# Patient Record
Sex: Female | Born: 1935 | Race: White | Hispanic: No | Marital: Married | Smoking: Former smoker
Health system: Southern US, Community
[De-identification: ages and names within clinical notes are randomized; demographics above are authoritative.]

## PROBLEM LIST (undated history)

## (undated) DIAGNOSIS — B029 Zoster without complications: Secondary | ICD-10-CM

## (undated) DIAGNOSIS — E785 Hyperlipidemia, unspecified: Secondary | ICD-10-CM

## (undated) HISTORY — DX: Zoster without complications: B02.9

## (undated) HISTORY — DX: Hyperlipidemia, unspecified: E78.5

---
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
## 1966-01-01 HISTORY — PX: CERVICAL LAMINECTOMY: SHX94

## 1972-01-02 HISTORY — PX: ABDOMINAL HYSTERECTOMY: SHX81

## 1973-01-01 HISTORY — PX: LUNG REMOVAL, PARTIAL: SHX233

## 1995-01-02 HISTORY — PX: OTHER SURGICAL HISTORY: SHX169

## 1997-07-22 ENCOUNTER — Ambulatory Visit (HOSPITAL_COMMUNITY): Admission: RE | Admit: 1997-07-22 | Discharge: 1997-07-22 | Payer: Self-pay | Admitting: *Deleted

## 1998-07-29 ENCOUNTER — Ambulatory Visit (HOSPITAL_COMMUNITY): Admission: RE | Admit: 1998-07-29 | Discharge: 1998-07-29 | Payer: Self-pay | Admitting: *Deleted

## 1998-07-29 ENCOUNTER — Ambulatory Visit (HOSPITAL_COMMUNITY): Admission: RE | Admit: 1998-07-29 | Discharge: 1998-07-29 | Payer: Self-pay

## 1999-07-31 ENCOUNTER — Encounter: Payer: Self-pay | Admitting: *Deleted

## 1999-07-31 ENCOUNTER — Ambulatory Visit (HOSPITAL_COMMUNITY): Admission: RE | Admit: 1999-07-31 | Discharge: 1999-07-31 | Payer: Self-pay | Admitting: Obstetrics & Gynecology

## 2000-08-02 ENCOUNTER — Ambulatory Visit (HOSPITAL_COMMUNITY): Admission: RE | Admit: 2000-08-02 | Discharge: 2000-08-02 | Payer: Self-pay | Admitting: *Deleted

## 2000-08-02 ENCOUNTER — Ambulatory Visit (HOSPITAL_COMMUNITY): Admission: RE | Admit: 2000-08-02 | Discharge: 2000-08-02 | Payer: Self-pay

## 2001-09-30 ENCOUNTER — Ambulatory Visit (HOSPITAL_COMMUNITY): Admission: RE | Admit: 2001-09-30 | Discharge: 2001-09-30 | Payer: Self-pay | Admitting: *Deleted

## 2001-09-30 ENCOUNTER — Encounter: Payer: Self-pay | Admitting: *Deleted

## 2002-10-05 ENCOUNTER — Ambulatory Visit (HOSPITAL_COMMUNITY): Admission: RE | Admit: 2002-10-05 | Discharge: 2002-10-05 | Payer: Self-pay | Admitting: *Deleted

## 2003-01-02 HISTORY — PX: OTHER SURGICAL HISTORY: SHX169

## 2007-01-02 HISTORY — PX: CATARACT EXTRACTION W/ INTRAOCULAR LENS  IMPLANT, BILATERAL: SHX1307

## 2007-09-09 ENCOUNTER — Ambulatory Visit (HOSPITAL_COMMUNITY): Admission: RE | Admit: 2007-09-09 | Discharge: 2007-09-09 | Payer: Self-pay | Admitting: Internal Medicine

## 2008-01-02 DIAGNOSIS — B029 Zoster without complications: Secondary | ICD-10-CM

## 2008-01-02 HISTORY — DX: Zoster without complications: B02.9

## 2008-03-28 ENCOUNTER — Emergency Department (HOSPITAL_COMMUNITY): Admission: EM | Admit: 2008-03-28 | Discharge: 2008-03-28 | Payer: Self-pay | Admitting: Emergency Medicine

## 2010-01-22 ENCOUNTER — Encounter: Payer: Self-pay | Admitting: Family Medicine

## 2010-04-13 LAB — DIFFERENTIAL
Basophils Relative: 0 % (ref 0–1)
Lymphocytes Relative: 23 % (ref 12–46)
Monocytes Absolute: 0.3 10*3/uL (ref 0.1–1.0)
Monocytes Relative: 6 % (ref 3–12)
Neutro Abs: 3.6 10*3/uL (ref 1.7–7.7)
Neutrophils Relative %: 71 % (ref 43–77)

## 2010-04-13 LAB — URINALYSIS, ROUTINE W REFLEX MICROSCOPIC
Bilirubin Urine: NEGATIVE
Glucose, UA: NEGATIVE mg/dL
Hgb urine dipstick: NEGATIVE
Ketones, ur: NEGATIVE mg/dL
Protein, ur: NEGATIVE mg/dL

## 2010-04-13 LAB — CBC
HCT: 46.7 % — ABNORMAL HIGH (ref 36.0–46.0)
Hemoglobin: 15.4 g/dL — ABNORMAL HIGH (ref 12.0–15.0)
MCHC: 32.9 g/dL (ref 30.0–36.0)
MCV: 90.6 fL (ref 78.0–100.0)
Platelets: 143 10*3/uL — ABNORMAL LOW (ref 150–400)
RDW: 14.1 % (ref 11.5–15.5)

## 2010-04-13 LAB — COMPREHENSIVE METABOLIC PANEL
Albumin: 4.1 g/dL (ref 3.5–5.2)
BUN: 9 mg/dL (ref 6–23)
Calcium: 9.2 mg/dL (ref 8.4–10.5)
Creatinine, Ser: 0.9 mg/dL (ref 0.4–1.2)
Glucose, Bld: 106 mg/dL — ABNORMAL HIGH (ref 70–99)
Total Protein: 6.2 g/dL (ref 6.0–8.3)

## 2010-05-25 IMAGING — CT CT PELVIS W/ CM
2 of 4 series · 17 of 46 positions shown, 19 images · IV contrast (APPLIED)
Comparison: None

CT ABDOMEN

CLINICAL DATA: Right lower quadrant pain and right flank pain for
5 days.

CT ABDOMEN AND PELVIS WITH CONTRAST
TECHNIQUE: Multidetector CT imaging of the abdomen and pelvis was
performed using the standard protocol following bolus
administration of intravenous contrast.
Contrast: 125 ml

[Series 2: abd_pel 5.0 b40f st · axial · 0.65mm/px · z∈[-486,-86]mm · 14 of 88 slices shown, 16 images]
[im 4/88  soft-tissue]
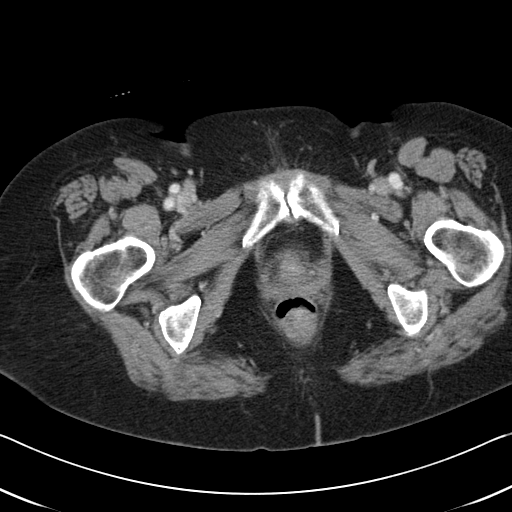
[im 4/88  bone]
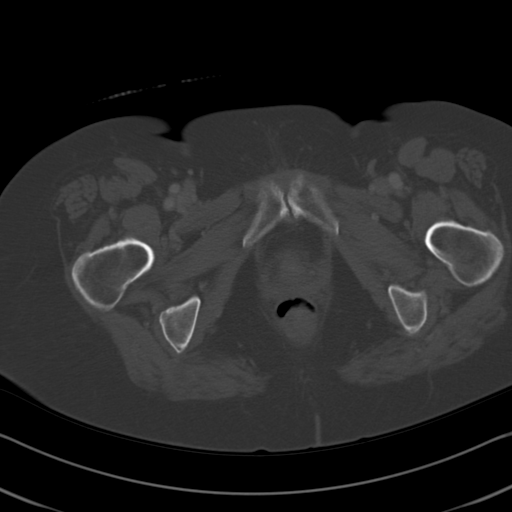
[im 11/88  soft-tissue]
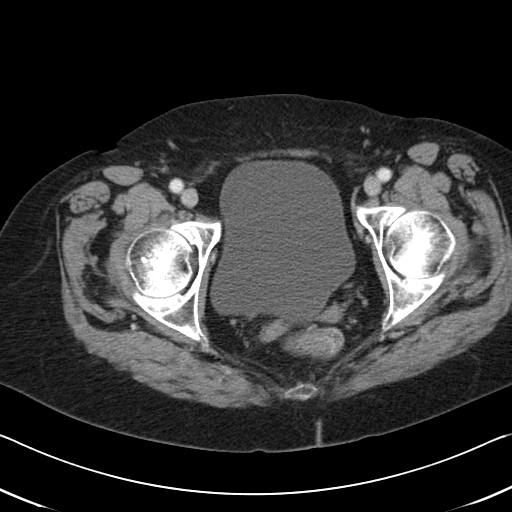
[im 19/88  soft-tissue]
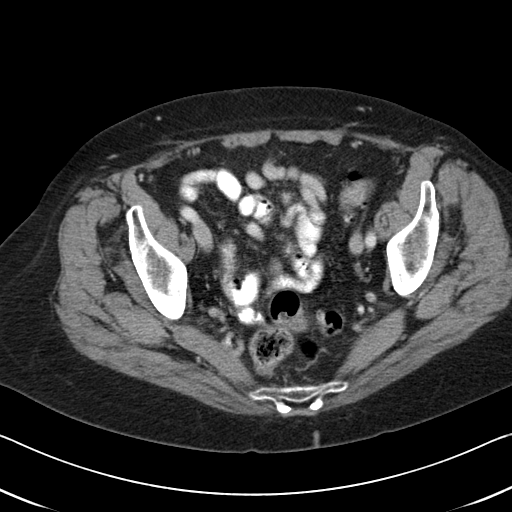
[im 22/88  soft-tissue]
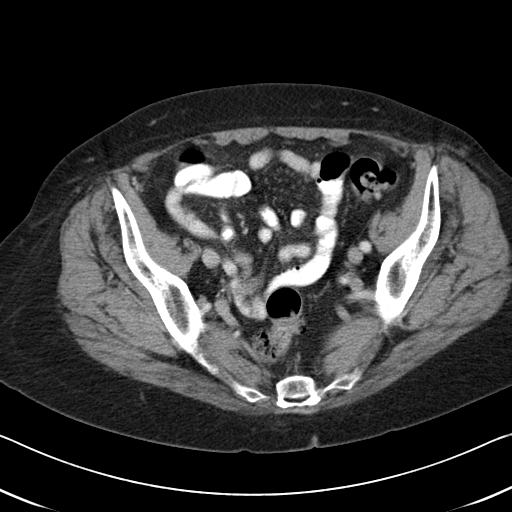
[im 30/88  soft-tissue]
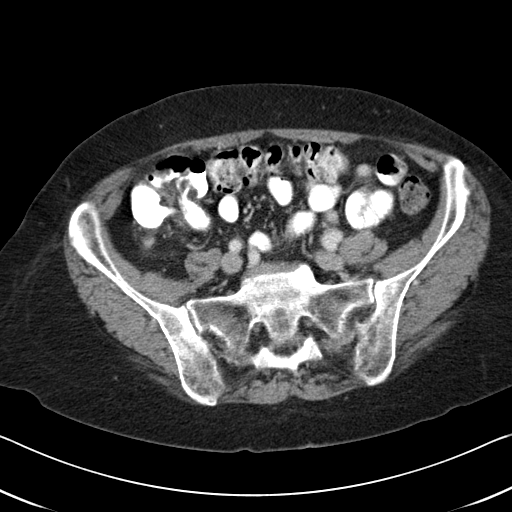
[im 37/88  soft-tissue]
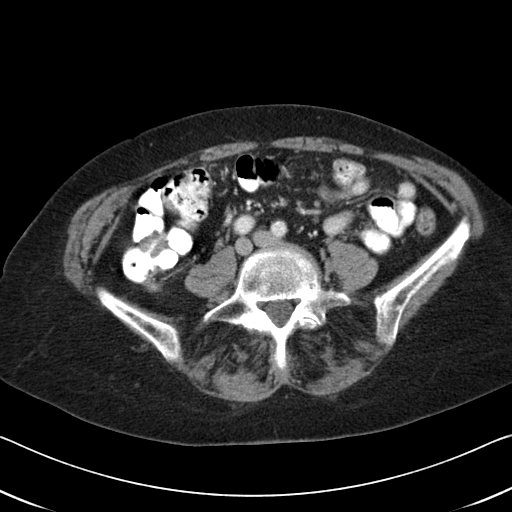
[im 40/88  soft-tissue]
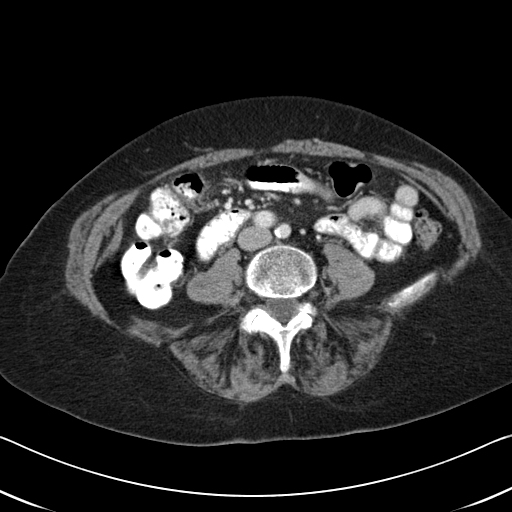
[im 48/88  soft-tissue]
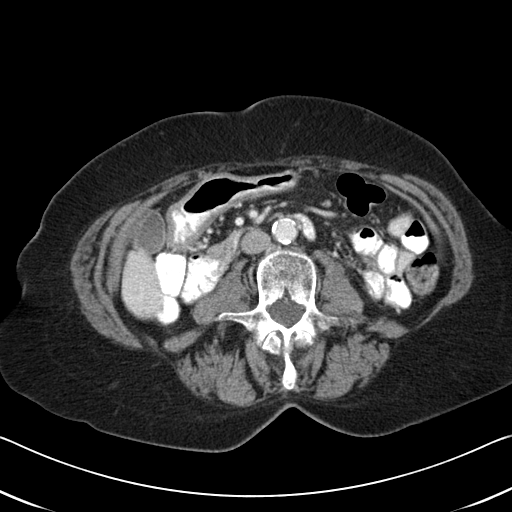
[im 51/88  soft-tissue]
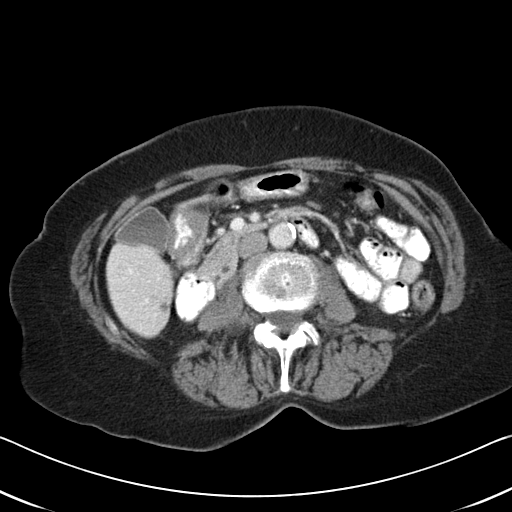
[im 51/88  bone]
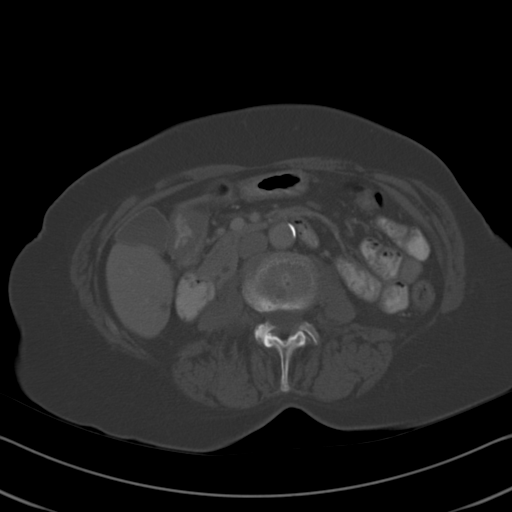
[im 59/88  soft-tissue]
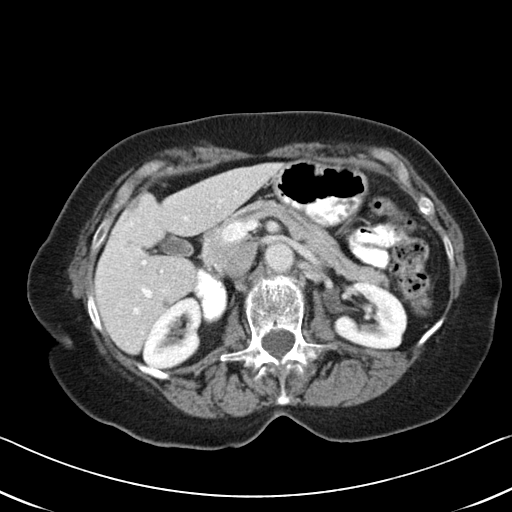
[im 66/88  soft-tissue]
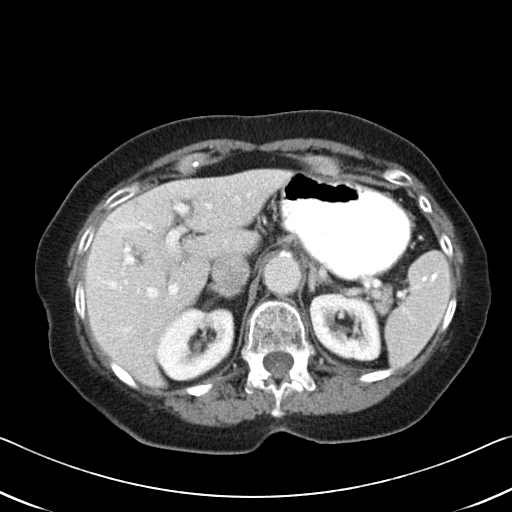
[im 69/88  soft-tissue]
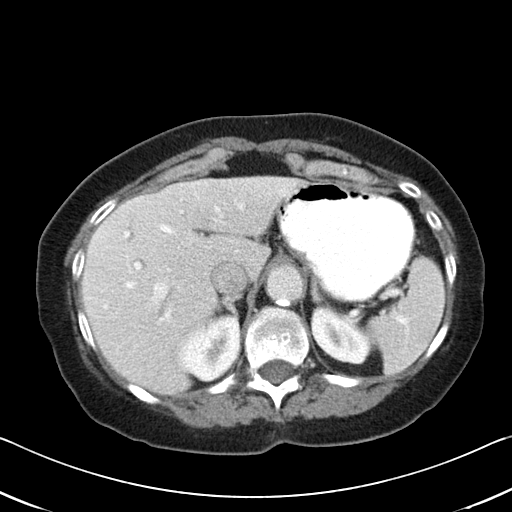
[im 77/88  soft-tissue]
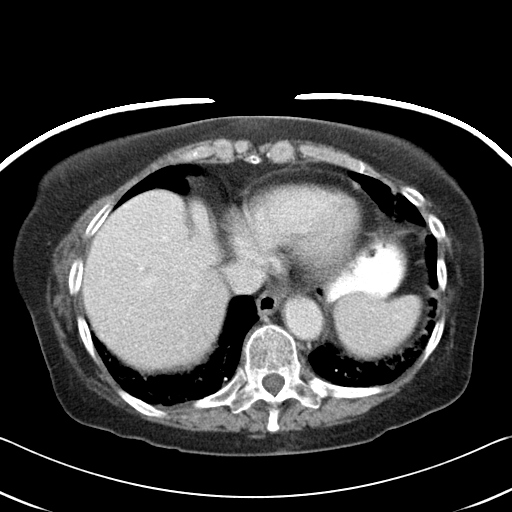
[im 84/88  soft-tissue]
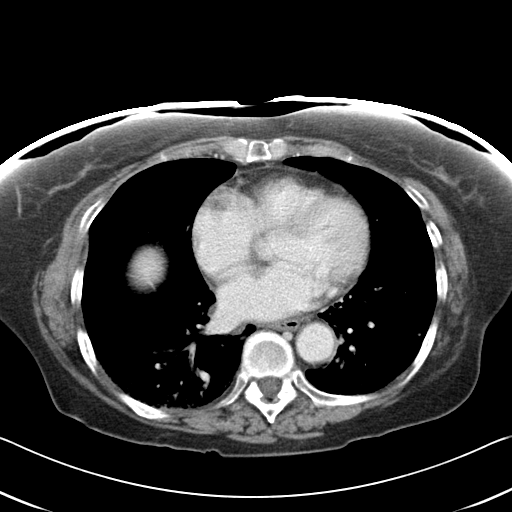

[Series 602: coronal abdomen · coronal · 0.89mm/px · 3 of 102 slices shown]
[im 34/102  soft-tissue]
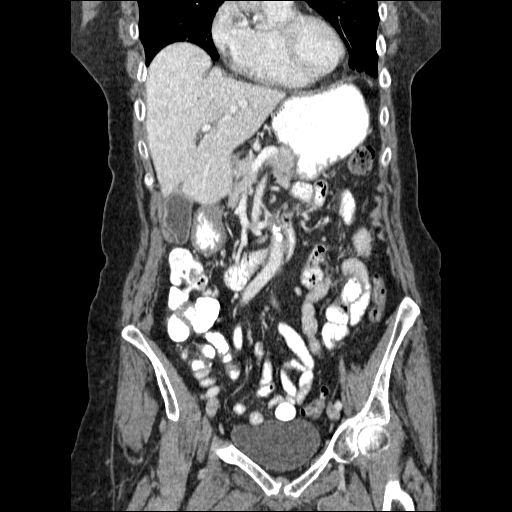
[im 45/102  soft-tissue]
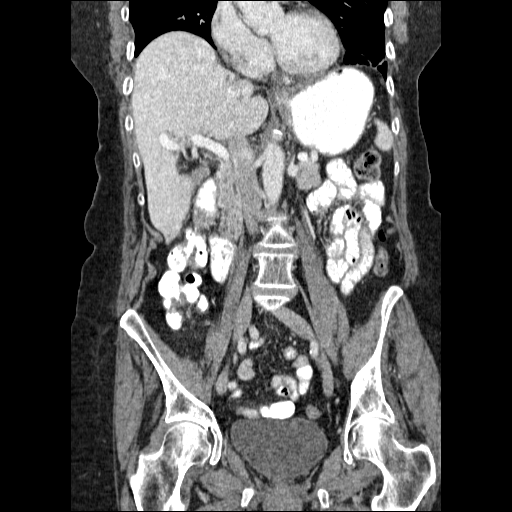
[im 57/102  soft-tissue]
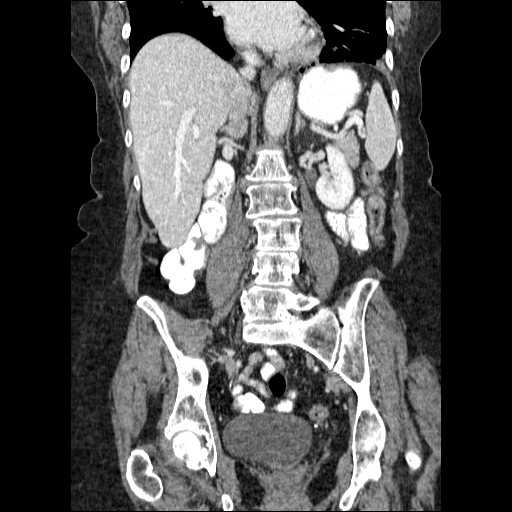

[17 of 46 positions shown; findings below may reference images not displayed]

FINDINGS: The liver, spleen, pancreas, adrenal glands, and kidneys
demonstrate no significant abnormalities.  There are two benign-
appearing small cysts in the right leg lower lobe of the liver.
There is a small focal area of fatty infiltration in the liver just
anterior to the gallbladder.

There are calcified granulomas in the spleen.

There are no dilated loops of large or small bowel.  No free air
free fluid or significant bony abnormalities.
IMPRESSION: Benign-appearing abdomen. There is mild atelectasis or scarring at
both lung bases posteriorly.

CT PELVIS
FINDINGS: The terminal ileum and appendix are normal.  Uterus and
ovaries have been removed.  There is no free fluid or
diverticulitis.  There are a few diverticuli in the distal colon.
No significant bony abnormalities.
IMPRESSION: Benign-appearing pelvis.

## 2010-08-07 ENCOUNTER — Telehealth: Payer: Self-pay | Admitting: Cardiovascular Disease

## 2010-08-07 NOTE — Telephone Encounter (Signed)
This is the wife off a patient that Dr. Elease Hashimoto see's.  She is seeing Dossie Arbour now, but does not feel comfortable.  She would like to request who Dr. Elease Hashimoto would recommend as a PCP for her.  She is having some GI issues.   [phone]

## 2010-08-07 NOTE — Telephone Encounter (Signed)
Patient was told it may be next week before there was a call back and she understands, she will call us back if she does not hear anything by next week.

## 2010-08-07 NOTE — Telephone Encounter (Signed)
Sent to dr Elease Hashimoto, will address next weekend.

## 2010-08-10 ENCOUNTER — Telehealth: Payer: Self-pay | Admitting: *Deleted

## 2010-08-10 NOTE — Telephone Encounter (Signed)
msg left for sugg on new pcp/ dr Rene Paci [phone]

## 2010-10-10 ENCOUNTER — Ambulatory Visit (INDEPENDENT_AMBULATORY_CARE_PROVIDER_SITE_OTHER): Payer: Medicare Other | Admitting: Internal Medicine

## 2010-10-10 ENCOUNTER — Encounter: Payer: Self-pay | Admitting: Internal Medicine

## 2010-10-10 DIAGNOSIS — E785 Hyperlipidemia, unspecified: Secondary | ICD-10-CM

## 2010-10-10 DIAGNOSIS — K6289 Other specified diseases of anus and rectum: Secondary | ICD-10-CM

## 2010-10-10 NOTE — Progress Notes (Signed)
Subjective:    Patient ID: Nancy Kent, female    DOB: 12-22-1925, 77 y.o.   MRN: [id number]  HPI Nancy Kent presents today to establish for on-going continuity care.   Her chief complaint today is rectal pain for the past two months. She has been using a hydrocortisone suppository for rectal pain. She has had no bleeding. She has no h/o internal hemorrhoid. Her last flexible sigmoidoscopy was greater than 8 years ago. She has not had any weight loss. She has not had a durable change in caliber or color of her stools.   She reports acute back pain yesterday when she awoke. This pain has lessened some over the past 24 hours. She did have increased urinary frequency yesterday as well along with urgency but no incontinence. She denies any strain or injury to cause her pain. No history of serious back problems.   Past Medical History  Diagnosis Date  . Hyperlipidemia   . Shingles 2010    happended after she had vaccine.   Past Surgical History  Procedure Date  . Tonsillectomy 1937  . Abdominal hysterectomy 1974    fibroid  . Lung removal, partial 1975    cocciodiomycosis. LUL lobe removed.  . Cervical laminectomy 1968    C5-6  . Arthroscopy 2005    left knee  . 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 Cataract extraction w/ intraocular lens  implant, bilateral 2007-05-26    bilateral   Family History  Problem Relation Age of Onset  . Heart disease Mother     CAD/MI - fatal  . Diabetes Mother   . Cancer Father     lung cancer  . Alzheimer's disease Sister   . Dementia Brother   . Heart disease Sister     CAD/MI  . Diabetes Brother    History   Social History  . Marital Status: Married    Spouse Name: N/A    Number of Children: 3  . Years of Education: 12   Occupational History  . clerical     retired   Social History Main Topics  . Smoking status: Former Smoker    Quit date: 10/10/1958  . Smokeless tobacco: Never Used  . Alcohol Use: No       former drinker - social  . Drug Use: No  . Sexually Active: Not Currently   Other Topics Concern  . Not on file   Social History Narrative   HSG, Business school. Married 2045-05-25 05-26-2030 yrs/widowed- husband died of lung cancer and she nursed him at home for 3 years. Married 05-26-1979 - husband is 57 with multiple medical problems. She is providing care w/o assistance 24/7 which is taxing. 2 sons - '49, '56; 1 dtr 05-25-48. 5 grandchildren, 3 great-grandchildren. Work - Warehouse manager, now retired. Lives with husband in an independent apartment. ACP- No CPR, no mechanical ventilation. No heroic care - no dialysis, no prolonged intensive care. Has a living will. Provided with out of facility order. Provided with blank MOST form (Oct '12) [facility] - [place], then d[place] in [place].       Review of Systems Constitutional:  Negative for fever, chills, activity change and unexpected weight change.  HEENT:  Positive for hearing loss but no audiology. No  ear pain, congestion, neck stiffness and postnasal drip. Negative for sore throat or swallowing problems. Negative for dental complaints.   Eyes: Negative for vision loss or change in visual acuity.  Respiratory:  Negative for chest tightness and wheezing. Negative for DOE.   Cardiovascular: Negative for chest pain or palpitations. No decreased exercise tolerance Gastrointestinal: No change in bowel habit. No bloating or gas. No reflux or indigestion Genitourinary: Negative for urgency, frequency, flank pain and difficulty urinating.  Musculoskeletal: Negative for myalgias, back pain, arthralgias and gait problem.  Neurological: Negative for dizziness, tremors, weakness and headaches. Mild balance problem Hematological: Negative for adenopathy.  Psychiatric/Behavioral: Negative for behavioral problems and dysphoric mood.       Objective:   Physical Exam Vitals - noted, normal BP, afebrile Gen'l - WNWD white woman who appears quite fit  HEENT- C&S clear, PERRLA, no oral  lesions, EACs/TMs normal Neck- supple, no thyromegaly Cor- 2+ radial pulse, RRR, no JVD, no carotid bruits Pulm - normal respirations        Assessment & Plan:  Rectal pain - patient gets some relief from hydrocortisone suppository. Last flex sig several years ago.  Plan - patient to return for follow-up exam soon. Will need anoscopy

## 2010-10-11 ENCOUNTER — Encounter: Payer: Self-pay | Admitting: Internal Medicine

## 2010-10-11 DIAGNOSIS — E785 Hyperlipidemia, unspecified: Secondary | ICD-10-CM | POA: Insufficient documentation

## 2010-10-11 NOTE — Assessment & Plan Note (Signed)
No lab information available at today's visit. She is not taking any medications.  Plan - obtain labs from prior physician with recommendations re: repeat lab or treatment to follow.

## 2010-10-13 ENCOUNTER — Ambulatory Visit (INDEPENDENT_AMBULATORY_CARE_PROVIDER_SITE_OTHER): Payer: Medicare Other | Admitting: Internal Medicine

## 2010-10-13 VITALS — BP 112/76 | HR 96 | Temp 97.1°F

## 2010-10-13 DIAGNOSIS — K6289 Other specified diseases of anus and rectum: Secondary | ICD-10-CM

## 2010-10-16 ENCOUNTER — Encounter: Payer: Self-pay | Admitting: Gastroenterology

## 2010-10-16 NOTE — Progress Notes (Signed)
  Subjective:    Patient ID: Nancy Kent, female    DOB: 1925/08/20, 77 y.o.   MRN: [id number]  HPI [name] was recently seen as a new patient - see tht note. She returns today for evaluation of rectal pain that has been going on for several weeks. She has had no bleeding, no report of hemorrhoids. She does get relief with cortisone suppositories.  She returns with a complete MOST form.  I have reviewed the patient's medical history in detail and updated the computerized patient record.    Review of Systems System review is negative for any constitutional, cardiac, pulmonary, GI or neuro symptoms or complaints other than as described in the HPI.     Objective:   Physical Exam Vitals noted  Procedure - Ansocopy Full explanation provided and patient gives informed consent. In the right lateral decubitus position - external exam normal w/o hemorrhoids; digital rectal exam w/o palpable hemorrhoids or mass. Anascope was gently introduced. Mucosa sampling above the scope was heme negative. On withdrawal of the scope no abnormalities were noted: no hemorrhoids, fissures or masses.  Patient tolerated procedure well.        Assessment & Plan:  Rectal pain - no explanation after exam.  Plan - refer to GI for further evaluation.

## 2010-10-30 ENCOUNTER — Ambulatory Visit: Payer: Self-pay | Admitting: Internal Medicine

## 2010-11-06 ENCOUNTER — Ambulatory Visit: Payer: Medicare Other | Admitting: Gastroenterology

## 2011-01-30 ENCOUNTER — Encounter: Payer: Self-pay | Admitting: Internal Medicine

## 2011-01-30 ENCOUNTER — Ambulatory Visit (INDEPENDENT_AMBULATORY_CARE_PROVIDER_SITE_OTHER): Payer: Medicare Other | Admitting: Internal Medicine

## 2011-01-30 ENCOUNTER — Ambulatory Visit (HOSPITAL_COMMUNITY)
Admission: RE | Admit: 2011-01-30 | Discharge: 2011-01-30 | Disposition: A | Discharge status: Home / Self Care | Payer: Medicare Other | Source: Ambulatory Visit | Attending: Internal Medicine | Admitting: Internal Medicine

## 2011-01-30 VITALS — BP 108/78 | HR 77 | Temp 97.6°F | Resp 14 | Wt 152.5 lb

## 2011-01-30 DIAGNOSIS — W19XXXA Unspecified fall, initial encounter: Secondary | ICD-10-CM | POA: Insufficient documentation

## 2011-01-30 DIAGNOSIS — S060X9A Concussion with loss of consciousness of unspecified duration, initial encounter: Secondary | ICD-10-CM

## 2011-01-30 DIAGNOSIS — R4789 Other speech disturbances: Secondary | ICD-10-CM

## 2011-01-30 MED ORDER — ALPRAZOLAM 0.5 MG PO TABS: 0.5000 mg | ORAL_TABLET | Freq: Every evening | ORAL | Status: DC | PRN

## 2011-01-30 NOTE — Patient Instructions (Signed)
After fall injury - the pain in your neck and anterior chest wall are muscle strain. Plan - neck rolls and chest stretches; tylenol 500 mg 1 or 2 three times a day. For unrelieved pain you may ADD motrin or aleve as directed. For subtle neurologic change will need to get a CT brain today to rule out a subdural hematoma - blood clot on the brain. If it is normal we are done. If there is a clot, with your exam being pretty normal, we will watch and wait and get a repeat study in 10-14 days. There is also the possibility of a mild concussion for which tincture of time is the cure.       Concussion and Brain Injury A blow or jolt to the head can disrupt the normal function of the brain. This type of brain injury is often called a "concussion" or a "closed head injury." Concussions are usually not life-threatening. Even so, the effects of a concussion can be serious.   CAUSES   A concussion is caused by a blunt blow to the head. The blow might be direct or indirect as described below.  Direct blow (running into another player during a soccer game, being hit in a fight, or hitting your head on a hard surface).     Indirect blow (when your head moves rapidly and violently back and forth like in a car crash).  SYMPTOMS   The brain is very complex. Every head injury is different. Some symptoms may appear right away. Other symptoms may not show up for days or weeks after the concussion. The signs of concussion can be hard to notice. Early on, problems may be missed by patients, family members, and caregivers. You may look fine even though you are acting or feeling differently.   These symptoms are usually temporary, but may last for days, weeks, or even longer. Symptoms include:  Mild headaches that will not go away.     Having more trouble than usual with:     Remembering things.     Paying attention or concentrating.     Organizing daily tasks.     Making decisions and solving problems.      Slowness in thinking, acting, speaking, or reading.     Getting lost or easily confused.     Feeling tired all the time or lacking energy (fatigue).     Feeling drowsy.     Sleep disturbances.     Sleeping more than usual.     Sleeping less than usual.     Trouble falling asleep.     Trouble sleeping (insomnia).     Loss of balance or feeling lightheaded or dizzy.     Nausea or vomiting.     Numbness or tingling.     Increased sensitivity to:     Sounds.    Lights.    Distractions.  Other symptoms might include:  Vision problems or eyes that tire easily.     Diminished sense of taste or smell.     Ringing in the ears.     Mood changes such as feeling sad, anxious, or listless.     Becoming easily irritated or angry for little or no reason.     Lack of motivation.  DIAGNOSIS   Your caregiver can usually diagnose a concussion or mild brain injury based on your description of your injury and your symptoms.   Your evaluation might include:  A brain scan to look for signs  of injury to the brain. Even if the test shows no injury, you may still have a concussion.     Blood tests to be sure other problems are not present.  TREATMENT    People with a concussion need to be examined and evaluated. Most people with concussions are treated in an emergency department, urgent care, or clinic. Some people must stay in the hospital overnight for further treatment.     Your caregiver will send you home with important instructions to follow. Be sure to carefully follow them.     Tell your caregiver if you are already taking any medicines (prescription, over-the-counter, or natural remedies), or if you are drinking alcohol or taking illegal drugs. Also, talk with your caregiver if you are taking blood thinners (anticoagulants) or aspirin. These drugs may increase your chances of complications. All of this is important information that may affect treatment.     Only take  over-the-counter or prescription medicines for pain, discomfort, or fever as directed by your caregiver.  PROGNOSIS   How fast people recover from brain injury varies from person to person. Although most people have a good recovery, how quickly they improve depends on many factors. These factors include how severe their concussion was, what part of the brain was injured, their age, and how healthy they were before the concussion.   Because all head injuries are different, so is recovery. Most people with mild injuries recover fully. Recovery can take time. In general, recovery is slower in older persons. Also, persons who have had a concussion in the past or have other medical problems may find that it takes longer to recover from their current injury. Anxiety and depression may also make it harder to adjust to the symptoms of brain injury. HOME CARE INSTRUCTIONS   Return to your normal activities slowly, not all at once. You must give your body and brain enough time for recovery.  Get plenty of sleep at night, and rest during the day. Rest helps the brain to heal.     Avoid staying up late at night.     Keep the same bedtime hours on weekends and weekdays.     Take daytime naps or rest breaks when you feel tired.     Limit activities that require a lot of thought or concentration (brain or cognitive rest). This includes:     Homework or job-related work.     Watching TV.     Computer work.     Avoid activities that could lead to a second brain injury, such as contact or recreational sports, until your caregiver says it is okay. Even after your brain injury has healed, you should protect yourself from having another concussion.     Ask your caregiver when you can return to your normal activities such as driving, bicycling, or operating heavy equipment. Your ability to react may be slower after a brain injury.     Talk with your caregiver about when you can return to work or school.      Inform your teachers, school nurse, school counselor, coach, Event organiser, or work Production designer, theatre/television/film about your injury, symptoms, and restrictions. They should be instructed to report:     Increased problems with attention or concentration.     Increased problems remembering or learning new information.     Increased time needed to complete tasks or assignments.     Increased irritability or decreased ability to cope with stress.     Increased symptoms.  Take only those medicines that your caregiver has approved.     Do not drink alcohol until your caregiver says you are well enough to do so. Alcohol and certain other drugs may slow your recovery and can put you at risk of further injury.     If it is harder than usual to remember things, write them down.     If you are easily distracted, try to do one thing at a time. For example, do not try to watch TV while fixing dinner.     Talk with family members or close friends when making important decisions.     Keep all follow-up appointments. Repeated evaluation of your symptoms is recommended for your recovery.  PREVENTION   Protect your head from future injury. It is very important to avoid another head or brain injury before you have recovered. In rare cases, another injury has lead to permanent brain damage, brain swelling, or death. Avoid injuries by using:  Seatbelts when riding in a car.     Alcohol only in moderation.     A helmet when biking, skiing, skateboarding, skating, or doing similar activities.     Safety measures in your home.     Remove clutter and tripping hazards from floors and stairways.     Use grab bars in bathrooms and handrails by stairs.     Place non-slip mats on floors and in bathtubs.     Improve lighting in dim areas.  SEEK MEDICAL CARE IF:   A head injury can cause lingering symptoms. You should seek medical care if you have any of the following symptoms for more than 3 weeks after your injury  or are planning to return to sports:  Chronic headaches.     Dizziness or balance problems.     Nausea.    Vision problems.     Increased sensitivity to noise or light.     Depression or mood swings.     Anxiety or irritability.     Memory problems.     Difficulty concentrating or paying attention.     Sleep problems.     Feeling tired all the time.  SEEK IMMEDIATE MEDICAL CARE IF:   You have had a blow or jolt to the head and you (or your family or friends) notice:  Severe or worsening headaches.     Weakness (even if only in one hand or one leg or one part of the face), numbness, or decreased coordination.     Repeated vomiting.     Increased sleepiness or passing out.     One black center of the eye (pupil) is larger than the other.     Convulsions (seizures).     Slurred speech.     Increasing confusion, restlessness, agitation, or irritability.     Lack of ability to recognize people or places.     Neck pain.     Difficulty being awakened.     Unusual behavior changes.     Loss of consciousness.  Older adults with a brain injury may have a higher risk of serious complications such as a blood clot on the brain. Headaches that get worse or an increase in confusion are signs of this complication. If these signs occur, see a caregiver right away. MAKE SURE YOU:    Understand these instructions.     Will watch your condition.     Will get help right away if you are not doing well or get worse.  FOR MORE INFORMATION   Several groups help people with brain injury and their families. They provide information and put people in touch with local resources. These include support groups, rehabilitation services, and a variety of health care professionals. Among these groups, the Brain Injury Association (BIA, www.biausa.org) has a Secretary/administrator that gathers scientific and educational information and works on a national level to help people with brain injury.    Document Released: 03/10/2003 Document Revised: 08/30/2010 Document Reviewed: 08/06/2007 [facility] Patient Information 2012 [place], [place].

## 2011-01-30 NOTE — Progress Notes (Signed)
Subjective:    Patient ID: Nancy Kent, female    DOB: 1925/11/10, 78 y.o.   MRN: [id number]  HPI Patient presents for evaluation of mental status changes after a fall Jan 18th when she lost her footing on the ice, falling backward striking her head hard on the hard ground. She did not loose consciousness but did feel bad for several days. She has had persistent neck pain with all range of motion, she has had anterior chest wall pain. Her memory is a little worse since the fall and word finding is more difficult. There has been no change in vision, hearing or coordination. A hematoma on the vertex scalp has resolved.   Past Medical History  Diagnosis Date  . Hyperlipidemia   . Shingles 2010    happended after she had vaccine.   Past Surgical History  Procedure Date  . Tonsillectomy 1937  . Abdominal hysterectomy 1974    fibroid  . Lung removal, partial 1975    cocciodiomycosis. LUL lobe removed.  . Cervical laminectomy 1968    C5-6  . Arthroscopy 2005    left knee  . 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 Cataract extraction w/ intraocular lens  implant, bilateral 2007/06/14    bilateral   Family History  Problem Relation Age of Onset  . Heart disease Mother     CAD/MI - fatal  . Diabetes Mother   . Cancer Father     lung cancer  . Alzheimer's disease Sister   . Dementia Brother   . Heart disease Sister     CAD/MI  . Diabetes Brother    History   Social History  . Marital Status: Married    Spouse Name: N/A    Number of Children: 3  . Years of Education: 12   Occupational History  . clerical     retired   Social History Main Topics  . Smoking status: Former Smoker    Quit date: 10/10/1958  . Smokeless tobacco: Never Used  . Alcohol Use: No     former drinker - social  . Drug Use: No  . Sexually Active: Not Currently   Other Topics Concern  . Not on file   Social History Narrative   HSG, Business school. Married 06-13-45 06/14/2030  yrs/widowed- husband died of lung cancer and she nursed him at home for 3 years. Married 06/14/1979 - husband is 41 with multiple medical problems. She is providing care w/o assistance 24/7 which is taxing. 2 sons - '49, '56; 1 dtr June 13, 2048. 5 grandchildren, 3 great-grandchildren. Work - Warehouse manager, now retired. Lives with husband in an independent apartment. ACP- No CPR, no mechanical ventilation. No heroic care - no dialysis, no prolonged intensive care. Has a living will. Provided with out of facility order. Provided with blank MOST form (Oct '12) [facility] - [place], then d[place] in [place].      Review of Systems System review is negative for any constitutional, cardiac, pulmonary, GI or neuro symptoms or complaints other than as described in the HPI.     Objective:   Physical Exam Filed Vitals:   01/30/11 1407  BP: 108/78  Pulse: 77  Temp: 97.6 F (36.4 C)  Resp: 14   Gen'l- WNWD white woman in no distress HEENT- no signs of trauma or hematoma Neck- 90% ROM with flexion and extension, 80% with rotation, normal shoulder shrug Chest - no bruising, tender to palpation along the sternum and the  costo-sternal junction. Neuro - A&O x 3, speech is clear, cognition seems normal. CN II- XII - nl facial symmetry and movement, no fasiculation of the tongue, EOMI, Fundi- nl disc and vasculature. MS 4/5 and equal; able to stand w/o assistance. Nl gait, nl tandem gait. DTRs - 2+ and symmetrical.       Assessment & Plan:  Head trauma - neuro exam is essentially normal, however her report of word finding and expression difficult with a decrease in memory since the fall raises concern for SDH.  Plan - CT head w/o contrast to r/o SDH  Addendum: CT normal  Plan - probable mild concussion from the fall as etiology of mild mental status changes           Tincture of time should see a return to baseline function.

## 2011-05-03 ENCOUNTER — Telehealth: Payer: Self-pay | Admitting: *Deleted

## 2011-05-03 NOTE — Telephone Encounter (Signed)
Pharmacist Augusto Gamble called for patient concerning her  Medication alprazolon , Due to the death of her son Ms. Preyer is having a hard time emotionally and with sleeping. She states she has to take 1/2 tablet more in the nite to get back to sleep of alprazolon. Which is going to make her run out of her medication sooner. Do you want to refill more for her or are there other options for her to help with her deal with the loss of her son . Last office visit 01/2011   Fannie Knee

## 2011-05-04 MED ORDER — ALPRAZOLAM 0.5 MG PO TABS: ORAL_TABLET | ORAL | Status: DC

## 2011-05-04 NOTE — Telephone Encounter (Signed)
Medication called to [facility]. Spoke with Ms Shumard  And is aware of medication. Nancy Kent

## 2011-05-04 NOTE — Telephone Encounter (Signed)
OK to take xanax 0.5 1 or 2 tabs at bedtime, #60, refill x 5

## 2011-10-30 ENCOUNTER — Ambulatory Visit (INDEPENDENT_AMBULATORY_CARE_PROVIDER_SITE_OTHER): Payer: Medicare Other

## 2011-10-30 DIAGNOSIS — Z23 Encounter for immunization: Secondary | ICD-10-CM

## 2011-11-16 ENCOUNTER — Other Ambulatory Visit: Payer: Self-pay | Admitting: *Deleted

## 2011-11-16 MED ORDER — ALPRAZOLAM 0.5 MG PO TABS: ORAL_TABLET | ORAL | Status: DC

## 2011-11-16 NOTE — Telephone Encounter (Signed)
Medication refill request.

## 2012-02-25 ENCOUNTER — Encounter: Payer: Self-pay | Admitting: Internal Medicine

## 2012-02-25 ENCOUNTER — Ambulatory Visit (INDEPENDENT_AMBULATORY_CARE_PROVIDER_SITE_OTHER): Payer: Medicare Other | Admitting: Internal Medicine

## 2012-02-25 ENCOUNTER — Other Ambulatory Visit (INDEPENDENT_AMBULATORY_CARE_PROVIDER_SITE_OTHER): Payer: Medicare Other

## 2012-02-25 VITALS — BP 110/74 | HR 66 | Temp 97.9°F | Resp 16 | Ht 65.0 in | Wt 150.2 lb

## 2012-02-25 DIAGNOSIS — E785 Hyperlipidemia, unspecified: Secondary | ICD-10-CM

## 2012-02-25 DIAGNOSIS — M2040 Other hammer toe(s) (acquired), unspecified foot: Secondary | ICD-10-CM

## 2012-02-25 DIAGNOSIS — Z5189 Encounter for other specified aftercare: Secondary | ICD-10-CM

## 2012-02-25 LAB — LIPID PANEL
HDL: 83.5 mg/dL (ref >39.00)
Triglycerides: 48 mg/dL (ref 0.0–149.0)
VLDL: 9.6 mg/dL (ref 0.0–40.0)

## 2012-02-25 LAB — COMPREHENSIVE METABOLIC PANEL
AST: 28 U/L (ref 0–37)
Albumin: 4 g/dL (ref 3.5–5.2)
Alkaline Phosphatase: 50 U/L (ref 39–117)
BUN: 16 mg/dL (ref 6–23)
Glucose, Bld: 84 mg/dL (ref 70–99)
Potassium: 4.8 mEq/L (ref 3.5–5.1)
Sodium: 140 mEq/L (ref 135–145)
Total Bilirubin: 0.9 mg/dL (ref 0.3–1.2)
Total Protein: 6.5 g/dL (ref 6.0–8.3)

## 2012-02-25 LAB — CBC WITH DIFFERENTIAL/PLATELET
Basophils Relative: 0.9 % (ref 0.0–3.0)
Eosinophils Absolute: 0.1 10*3/uL (ref 0.0–0.7)
Eosinophils Relative: 2.6 % (ref 0.0–5.0)
HCT: 42.2 % (ref 36.0–46.0)
Lymphs Abs: 1.9 10*3/uL (ref 0.7–4.0)
MCHC: 33.4 g/dL (ref 30.0–36.0)
MCV: 88.8 fl (ref 78.0–100.0)
Monocytes Absolute: 0.4 10*3/uL (ref 0.1–1.0)
RBC: 4.76 Mil/uL (ref 3.87–5.11)
WBC: 5.4 10*3/uL (ref 4.5–10.5)

## 2012-02-25 MED ORDER — TRAZODONE HCL 50 MG PO TABS: 50.0000 mg | ORAL_TABLET | Freq: Every evening | ORAL | Status: DC | PRN

## 2012-02-25 NOTE — Patient Instructions (Addendum)
1. Back pain - sounds like you have degenerative disk disease of the lumbar spine Plan Go to YouTube.com and search for videos on low back exercise so that you can do treatment at home without going to a physical therapist  OK to use Aleve as needed twice a day if necessary  2. Foot pain - good shoes help. It may be arthritis as well as hammer toes. Plan -  Good shoes  Aleve  3. Sleep apnea - hard to know if this is the problem without a sleep study. There are serious health risks when people have severe sleep apnea  4. Cholesterol levels   If the cholesterol is very high, given your family history of heart disease medical therapy may be helpful.  5. Grief and loss - if the pain does not get better and it interferes with your ability to discharge your responsibilities there is help available.  6. Depression with minor anxiety - please stop Xanax. It is contra-indicated in the elderly patient due to dangerous side effects including increased risk of falls and hip fractures along with pseudo-dementia. Plan Start Trazodone 50 mg at bedtime for sleep and depression.  7. Rectal pain - had anoscopy in the office in Oct '12 - a negative study. The next step would be referral to GI.

## 2012-02-25 NOTE — Progress Notes (Signed)
Subjective:    Patient ID: Nancy Kent, female    DOB: November 21, 1925, 79 y.o.   MRN: [id number]  HPI The patient is here for annual Medicare wellness examination and management of other chronic and acute problems.  She has been having low back pain. She was diagnosed with spondylosis with myelopathy but she describes being told she had DDD. 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 She is also c/o foot pain. She does get relief with Aleve.   She is still grieving for the sudden loss of her son who died of an MI 04/23/22. She has not recovered. She is opposed to getting any type of help or grief counseling.   She reports that she will awaken in the night gasping for breath which she associates with a concern for apnea. She is not willing to have a sleep study because she cannot be away from her husband.   The risk factors are reflected in the social history.  The roster of all physicians providing medical care to patient - is listed in the Snapshot section of the chart.  Activities of daily living:  The patient is 100% inedpendent in all ADLs: dressing, toileting, feeding as well as independent mobility  Home safety : The patient has smoke detectors in the home. Falls - no falls.They wear seatbelts. No firearms at home  There is no violence in the home.   There is no risks for hepatitis, STDs or HIV. There is no   history of blood transfusion. They have no travel history to infectious disease endemic areas of the world.  The patient has seen their dentist in the last 18 months. Full upper denture and partial lower. They have seen their eye doctor in the last  2 years. They have hearing difficulty and have not had audiologic testing in the last year. She tinnitus which is unbearable with hearing aids.   They do not  have excessive sun exposure. Discussed the need for sun protection: hats, long sleeves and use of sunscreen if  there is significant sun exposure.   Diet: the importance of a healthy diet is discussed. They do have a healthy diet.  The patient has no regular exercise program.  The benefits of regular aerobic exercise were discussed.  Depression screen: there are signs or vegative symptoms of depression- irritability, change in appetite, anhedonia, sadness/tearfullness.   Cognitive assessment: the patient manages all their financial and personal affairs and is actively engaged.   The following portions of the patient's history were reviewed and updated as appropriate: allergies, current medications, past family history, past medical history,  past surgical history, past social history  and problem list.  Past Medical History  Diagnosis Date  . Hyperlipidemia   . Shingles 2010    happended after she had vaccine.   Past Surgical History  Procedure Laterality Date  . Tonsillectomy  1937  . Abdominal hysterectomy  1974    fibroid  . Lung removal, partial  1975    cocciodiomycosis. LUL lobe removed.  . Cervical laminectomy  1968    C5-6  . Arthroscopy  2005    left knee  . 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 Cataract extraction w/ intraocular lens  implant, bilateral  2009    bilateral   Family History  Problem Relation Age of Onset  . Heart disease Mother  CAD/MI - fatal  . Diabetes Mother   . Cancer Father     lung cancer  . Alzheimer's disease Sister   . Dementia Brother   . Heart disease Sister     CAD/MI  . Diabetes Brother    History   Social History  . Marital Status: Married    Spouse Name: N/A    Number of Children: 3  . Years of Education: 12   Occupational History  . clerical     retired   Social History Main Topics  . Smoking status: Former Smoker    Quit date: 10/10/1958  . Smokeless tobacco: Never Used  . Alcohol Use: No     Comment: former drinker - social  . Drug Use: No  . Sexually Active: Not Currently   Other Topics Concern   . Not on file   Social History Narrative   HS[employer], Business school. Married 06/08/2045 2030/06/09 yrs/widowed- husband died of lung cancer and she nursed him at home for 3 years. Married 06-09-79 - husband is 42 with multiple medical problems. She is providing care w/o assistance 24/7 which is taxing. 2 sons - '49, '56; 1 dtr 2048-06-08. 5 grandchildren, 3 great-grandchildren. Work - Warehouse manager, now retired. Lives with husband in an independent apartment. ACP- No CPR, no mechanical ventilation. No heroic care - no dialysis, no prolonged intensive care. Has a living will. Provided with out of facility order. Provided with blank MOST form (Oct '12) [facility] - [place], then dtr in [place].    No current outpatient prescriptions on file prior to visit.   No current facility-administered medications on file prior to visit.     Vision, hearing, body mass index were assessed and reviewed.   During the course of the visit the patient was educated and counseled about appropriate screening and preventive services including : fall prevention , diabetes screening, nutrition counseling, colorectal cancer screening, and recommended immunizations.    Review of Systems Constitutional:  Negative for fever, chills, activity change and unexpected weight change.  HEENT:  Negative for hearing loss, ear pain, congestion, neck stiffness and postnasal drip. Negative for sore throat or swallowing problems. Negative for dental complaints.   Eyes: Negative for vision loss or change in visual acuity.  Respiratory: Negative for chest tightness and wheezing. Negative for DOE.   Cardiovascular: Negative for chest pain or palpitations. No decreased exercise tolerance Gastrointestinal: No change in bowel habit. No bloating or gas. No reflux or indigestion Genitourinary: Negative for urgency, frequency, flank pain and difficulty urinating.  Musculoskeletal: Negative for myalgias, back pain, arthralgias and gait problem.  Neurological: Negative for dizziness,  tremors, weakness and headaches.  Hematological: Negative for adenopathy.  Psychiatric/Behavioral: Negative for behavioral problems and dysphoric mood.       Objective:   Physical Exam Filed Vitals:   02/25/12 0958  BP: 110/74  Pulse: 66  Temp: 97.9 F (36.6 C)  Resp: 16   Wt Readings from Last 3 Encounters:  02/25/12 150 lb 4 oz (68.153 kg)  01/30/11 152 lb 8 oz (69.174 kg)  10/10/10 155 lb (70.308 kg)   Gen'l: well nourished, well developed white Woman in no distress HEENT - [place]/AT, EACs/TMs normal, oropharynx with native dentition in good condition, no buccal or palatal lesions, posterior pharynx clear, mucous membranes moist. C&S clear, PERRLA, fundi - normal Neck - supple, no thyromegaly Nodes- negative submental, cervical, supraclavicular regions Chest - no deformity, no CVAT Lungs - clear without rales, wheezes. No increased work of  breathing Breast - deferred Cardiovascular - regular rate and rhythm, quiet precordium, no murmurs, rubs or gallops, 2+ radial, DP and PT pulses Abdomen - BS+ x 4, no HSM, no guarding or rebound or tenderness Pelvic - deferred to age Rectal - deferred to age Extremities - no clubbing, cyanosis, edema or deformity.  Neuro - A&O x 3, CN II-XII normal, motor strength normal and equal, DTRs 2+ and symmetrical biceps, radial, and patellar tendons. Cerebellar - no tremor, no rigidity, fluid movement and normal gait. Derm - Head, neck, back, abdomen and extremities without suspicious lesions  Lab Results  Component Value Date   WBC 5.4 02/25/2012   HGB 14.1 02/25/2012   HCT 42.2 02/25/2012   PLT 152.0 02/25/2012   GLUCOSE 84 02/25/2012   CHOL 239* 02/25/2012   TRIG 48.0 02/25/2012   HDL 83.50 02/25/2012   LDLDIRECT 132.4 02/25/2012   ALT 20 02/25/2012   AST 28 02/25/2012   NA 140 02/25/2012   K 4.8 02/25/2012   CL 106 02/25/2012   CREATININE 0.9 02/25/2012   BUN 16 02/25/2012   CO2 27 02/25/2012   TSH 0.74 02/25/2012           Assessment &  Plan:

## 2012-02-26 DIAGNOSIS — Z0000 Encounter for general adult medical examination without abnormal findings: Secondary | ICD-10-CM | POA: Insufficient documentation

## 2012-02-26 NOTE — Assessment & Plan Note (Signed)
Interval history remarkable for on-going back pain, foot pain from DJD and hammer toe deformities and Grief for which she declines counseling. Physical exam is normal. Lab results are in normal range. She is current with colorectal cancer screening and has aged out of further exams or further mammography. Immunizations are current except she is due for Tetanus.  In summary - a nice woman who puts care of others, her husband, ahead of her own welfare. She is informed that help is available for her problems should she choose to accept it. She will return as needed or in 6 months.

## 2012-02-26 NOTE — Assessment & Plan Note (Signed)
LDL cholesterol is very close to goal of 130 or less and at her age there is no clear indication for medical treatment. She does have a strong family history for heart disease but no other major risk factors.  Plan Prudent low fat diet and regular exercise.

## 2012-02-26 NOTE — Assessment & Plan Note (Signed)
Patient is not a surgical candidate and she reports she cannot attend PT sessions.  Plan NSAIDs as needed - OTC Aleve  Referred to YouTube.com for stretching instruction.

## 2012-02-26 NOTE — Assessment & Plan Note (Signed)
Discussed the recommendation against using benzodiazepines in the elderly. Also she has vegative signs of depression - mild  Plan D/c Xanax  Start Trazodone 50 mg qhs, may need an increase to 100 mg if not adequate for sleep and relief of depression.

## 2012-05-30 ENCOUNTER — Other Ambulatory Visit: Payer: Self-pay

## 2012-06-01 MED ORDER — ALPRAZOLAM 0.5 MG PO TABS: 0.5000 mg | ORAL_TABLET | Freq: Every evening | ORAL | Status: DC | PRN

## 2012-06-02 NOTE — Telephone Encounter (Signed)
Alprazolam called to pharmacy  

## 2012-08-15 ENCOUNTER — Encounter: Payer: Self-pay | Admitting: Internal Medicine

## 2012-08-15 ENCOUNTER — Ambulatory Visit (INDEPENDENT_AMBULATORY_CARE_PROVIDER_SITE_OTHER): Payer: Medicare Other | Admitting: Internal Medicine

## 2012-08-15 VITALS — BP 120/82 | HR 65 | Temp 97.7°F | Ht 65.5 in | Wt 152.2 lb

## 2012-08-15 DIAGNOSIS — F418 Other specified anxiety disorders: Secondary | ICD-10-CM

## 2012-08-15 DIAGNOSIS — B370 Candidal stomatitis: Secondary | ICD-10-CM

## 2012-08-15 DIAGNOSIS — F341 Dysthymic disorder: Secondary | ICD-10-CM

## 2012-08-15 DIAGNOSIS — K130 Diseases of lips: Secondary | ICD-10-CM | POA: Insufficient documentation

## 2012-08-15 DIAGNOSIS — K047 Periapical abscess without sinus: Secondary | ICD-10-CM

## 2012-08-15 MED ORDER — CLOTRIMAZOLE-BETAMETHASONE 1-0.05 % EX CREA: TOPICAL_CREAM | CUTANEOUS | Status: DC

## 2012-08-15 MED ORDER — NYSTATIN 100000 UNIT/ML MT SUSP: 500000.0000 [IU] | Freq: Four times a day (QID) | OROMUCOSAL | Status: DC

## 2012-08-15 MED ORDER — AMOXICILLIN 500 MG PO CAPS: 1000.0000 mg | ORAL_CAPSULE | Freq: Two times a day (BID) | ORAL | Status: DC

## 2012-08-15 NOTE — Progress Notes (Signed)
Subjective:    Patient ID: Nancy Kent, female    DOB: [birth year], 79 y.o.   MRN: [id number]  HPI    Here to f/u after seeing dental recently, has only 6 lower front teeth left, mentioned some mild discomfort to dentist who prescribed nystatin ointment for topical use to the tongue which she has been reticent to comply further after initially trying this;  Today with pain/swelling to left lower front tooth 2nd from left worsening in the past 2 days, without drainage, fever but unusual for her.  Also has some angular cheilits it seems to both corners of mouth for several wks.  Mentions mult times her distress over being the primary caretaker for her essentially invalid 97yo husband. Past Medical History  Diagnosis Date  . Hyperlipidemia   . Shingles 2010    happended after she had vaccine.   Past Surgical History  Procedure Laterality Date  . Tonsillectomy  1937  . Abdominal hysterectomy  1974    fibroid  . Lung removal, partial  1975    cocciodiomycosis. LUL lobe removed.  . Cervical laminectomy  1968    C5-6  . Arthroscopy  2005    left knee  . 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 Cataract extraction w/ intraocular lens  implant, bilateral  2009    bilateral    reports that she quit smoking about 53 years ago. She has never used smokeless tobacco. She reports that she does not drink alcohol or use illicit drugs. family history includes Alzheimer's disease in her sister; Cancer in her father; Dementia in her brother; Diabetes in her brother and mother; Heart disease in her mother and sister. No Known Allergies Current Outpatient Prescriptions on File Prior to Visit  Medication Sig Dispense Refill  . ALPRAZolam (XANAX) 0.5 MG tablet Take 1 tablet (0.5 mg total) by mouth at bedtime as needed for sleep.  30 tablet  5  . Naproxen Sodium (ALEVE PO) Take 2 tablets by mouth every 8 (eight) hours as needed (pain).      Marland Kitchen aspirin 81 MG tablet Take 81 mg by mouth  daily.       No current facility-administered medications on file prior to visit.    Review of Systems  Constitutional: Negative for unexpected weight change, or unusual diaphoresis  HENT: Negative for tinnitus.   Eyes: Negative for photophobia and visual disturbance.  Respiratory: Negative for choking and stridor.   Gastrointestinal: Negative for vomiting and blood in stool.  Genitourinary: Negative for hematuria and decreased urine volume.  Musculoskeletal: Negative for acute joint swelling Skin: Negative for color change and wound.  Neurological: Negative for tremors and numbness other than noted  Psychiatric/Behavioral: Negative for decreased concentration or  hyperactivity.       Objective:   Physical Exam BP 120/82  Pulse 65  Temp(Src) 97.7 F (36.5 C) (Oral)  Ht 5' 5.5" (1.664 m)  Wt 152 lb 4 oz (69.06 kg)  BMI 24.94 kg/m2  SpO2 96% VS noted,  Constitutional: Pt appears well-developed and well-nourished.  HENT: Head: NCAT.  Right Ear: External ear normal.  Left Ear: External ear normal.  Tongue with whitish coating, also angular cheilits to corners of mouth Left lower job 2nd tooth from left with red/tender gum associated Eyes: Conjunctivae and EOM are normal. Pupils are equal, round, and reactive to light.  Neck: Normal range of motion. Neck supple.  Cardiovascular: Normal rate and regular rhythm.   Pulmonary/Chest:  Effort normal and breath sounds normal.  Neurological: Pt is alert. Not confused  Skin: Skin is warm. No erythema.  Psychiatric: Pt behavior is normal. Thought content normal.     Assessment & Plan:

## 2012-08-15 NOTE — Patient Instructions (Signed)
Please take all new medication as prescribed - the antibiotic pill for the tooth, nystatin solution for the mouth, and the cream for cracking of the mouth You can also use vaseline for the dry lips as well, if the chapstick does not help Please continue all other medications as before Please have the pharmacy call with any other refills you may need.  Please remember to sign up for My Chart if you have not done so, as this will be important to you in the future with finding out test results, communicating by private email, and scheduling acute appointments online when needed.

## 2012-08-17 NOTE — Assessment & Plan Note (Signed)
Ok for amoxil asd,  to f/u any worsening symptoms or concerns

## 2012-08-17 NOTE — Assessment & Plan Note (Signed)
Ok for nystatin asd,  to f/u any worsening symptoms or concerns 

## 2012-08-17 NOTE — Assessment & Plan Note (Signed)
For lotrisone prn,  to f/u any worsening symptoms or concerns  

## 2012-08-17 NOTE — Assessment & Plan Note (Signed)
D/w pt, delcines med tx, counseling

## 2012-08-22 ENCOUNTER — Telehealth: Payer: Self-pay | Admitting: *Deleted

## 2012-08-22 NOTE — Telephone Encounter (Signed)
Patient informed. 

## 2012-08-22 NOTE — Telephone Encounter (Signed)
Pt called stating that she finished her rx for amox and it is still not clear yet. Please advise.

## 2012-08-22 NOTE — Telephone Encounter (Signed)
At this point needs to f/u with dental

## 2012-09-20 ENCOUNTER — Emergency Department (HOSPITAL_COMMUNITY)
Admission: EM | Admit: 2012-09-20 | Discharge: 2012-09-20 | Disposition: A | Discharge status: Home / Self Care | Payer: Medicare Other | Attending: Emergency Medicine | Admitting: Emergency Medicine

## 2012-09-20 ENCOUNTER — Encounter (HOSPITAL_COMMUNITY): Payer: Self-pay

## 2012-09-20 DIAGNOSIS — E785 Hyperlipidemia, unspecified: Secondary | ICD-10-CM | POA: Insufficient documentation

## 2012-09-20 DIAGNOSIS — Z87891 Personal history of nicotine dependence: Secondary | ICD-10-CM | POA: Insufficient documentation

## 2012-09-20 DIAGNOSIS — Z8619 Personal history of other infectious and parasitic diseases: Secondary | ICD-10-CM | POA: Insufficient documentation

## 2012-09-20 DIAGNOSIS — S51809A Unspecified open wound of unspecified forearm, initial encounter: Secondary | ICD-10-CM | POA: Insufficient documentation

## 2012-09-20 DIAGNOSIS — Y92009 Unspecified place in unspecified non-institutional (private) residence as the place of occurrence of the external cause: Secondary | ICD-10-CM | POA: Insufficient documentation

## 2012-09-20 DIAGNOSIS — S51851A Open bite of right forearm, initial encounter: Secondary | ICD-10-CM

## 2012-09-20 DIAGNOSIS — W5501XA Bitten by cat, initial encounter: Secondary | ICD-10-CM

## 2012-09-20 DIAGNOSIS — Z79899 Other long term (current) drug therapy: Secondary | ICD-10-CM | POA: Insufficient documentation

## 2012-09-20 DIAGNOSIS — IMO0001 Reserved for inherently not codable concepts without codable children: Secondary | ICD-10-CM | POA: Insufficient documentation

## 2012-09-20 DIAGNOSIS — Y939 Activity, unspecified: Secondary | ICD-10-CM | POA: Insufficient documentation

## 2012-09-20 MED ORDER — AMOXICILLIN-POT CLAVULANATE 875-125 MG PO TABS
1.0000 | ORAL_TABLET | Freq: Once | ORAL | Status: AC
  Administered 2012-09-20: 1 via ORAL
  Filled 2012-09-20: qty 1

## 2012-09-20 MED ORDER — TETANUS-DIPHTH-ACELL PERTUSSIS 5-2.5-18.5 LF-MCG/0.5 IM SUSP
0.5000 mL | Freq: Once | INTRAMUSCULAR | Status: AC
  Administered 2012-09-20: 0.5 mL via INTRAMUSCULAR
  Filled 2012-09-20: qty 0.5

## 2012-09-20 MED ORDER — AMOXICILLIN-POT CLAVULANATE 875-125 MG PO TABS: 1.0000 | ORAL_TABLET | Freq: Two times a day (BID) | ORAL | Status: DC

## 2012-09-20 NOTE — ED Notes (Signed)
Poison control contacted. Patient gave PC a description of cat and the circumstances under which she was bitten.

## 2012-09-20 NOTE — ED Notes (Signed)
Wound cleansed with warm soapy water and betadine

## 2012-09-20 NOTE — ED Notes (Addendum)
Patient bitten by a stray cat to the right forearm. Does not know when she had her last tetanus

## 2012-09-20 NOTE — Discharge Instructions (Signed)

## 2012-09-20 NOTE — ED Provider Notes (Signed)
CSN: [id number]     Arrival date & time 09/20/12  1123 History   First MD Initiated Contact with Patient 09/20/12 1148     Chief Complaint  Patient presents with  . Animal Bite   (Consider location/radiation/quality/duration/timing/severity/associated sxs/prior Treatment) HPI Comments: Pt lives in an apartment complex, several wild and domestic cats around.  She reports a large, orange cat came around corner while she was on patio, and the cat moved around, rubbed itself on the furniture, did not act angry, was not hissing or erratic in behavior.  Pt decided to try to pet it and when she did, the cat bit her on the forearm.   Pt went to [facility] walk in and recommended that she come to the ED for rabies vaccine.  Pt does not wish rabies vaccine.     Patient is a 79 y.o. female presenting with animal bite. The history is provided by the patient and the spouse.  Animal Bite Contact animal:  Cat Location:  Shoulder/arm Shoulder/arm injury location:  R forearm Time since incident:  3 hours Pain details:    Quality:  Aching   Severity:  Mild   Timing:  Constant   Progression:  Unchanged Incident location:  Home Provoked: provoked   Notifications:  None Animal's rabies vaccination status:  Unknown Animal in possession: no   Tetanus status:  Out of date Relieved by:  Rest Ineffective treatments:  None tried Associated symptoms: no fever, no numbness and no rash     Past Medical History  Diagnosis Date  . Hyperlipidemia   . Shingles 2010    happended after she had vaccine.   Past Surgical History  Procedure Laterality Date  . Tonsillectomy  1937  . Abdominal hysterectomy  1974    fibroid  . Lung removal, partial  1975    cocciodiomycosis. LUL lobe removed.  . Cervical laminectomy  1968    C5-6  . Arthroscopy  2005    left knee  . 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 Cataract extraction w/ intraocular lens  implant, bilateral  2009    bilateral   Family  History  Problem Relation Age of Onset  . Heart disease Mother     CAD/MI - fatal  . Diabetes Mother   . Cancer Father     lung cancer  . Alzheimer's disease Sister   . Dementia Brother   . Heart disease Sister     CAD/MI  . Diabetes Brother    History  Substance Use Topics  . Smoking status: Former Smoker    Quit date: 10/10/1958  . Smokeless tobacco: Never Used  . Alcohol Use: No     Comment: former drinker - social   OB History   Grav Para Term Preterm Abortions TAB SAB Ect Mult Living                 Review of Systems  Constitutional: Negative for fever.  Musculoskeletal: Positive for arthralgias.  Skin: Positive for wound. Negative for rash.  Neurological: Negative for numbness.    Allergies  Review of patient's allergies indicates no known allergies.  Home Medications   Current Outpatient Rx  Name  Route  Sig  Dispense  Refill  . ALPRAZolam (XANAX) 0.5 MG tablet   Oral   Take 1 tablet (0.5 mg total) by mouth at bedtime as needed for sleep.   30 tablet   5   . naproxen sodium (ANAPROX)  220 MG tablet   Oral   Take 220 mg by mouth 2 (two) times daily with a meal.         . amoxicillin-clavulanate (AUGMENTIN) 875-125 MG per tablet   Oral   Take 1 tablet by mouth 2 (two) times daily.   14 tablet   0    BP 137/60  Pulse 71  Temp(Src) 97.8 F (36.6 C) (Oral)  Resp 16  SpO2 94% Physical Exam  Nursing note and vitals reviewed. Constitutional: She is oriented to person, place, and time. She appears well-developed and well-nourished. No distress.  HENT:  Head: Normocephalic and atraumatic.  Cardiovascular:  Pulses:      Radial pulses are 2+ on the right side.  Musculoskeletal:       Right forearm: She exhibits tenderness.       Arms: Neurological: She is alert and oriented to person, place, and time. She has normal strength. She is not disoriented. No sensory deficit. She exhibits normal muscle tone. Coordination normal.  Skin: Skin is warm. She  is not diaphoretic.  Psychiatric: She has a normal mood and affect.    ED Course  Procedures (including critical care time) Labs Review Labs Reviewed - No data to display Imaging Review No results found.  ra sat is 94% and I interpret to be adequate  MDM   1. Cat bite of forearm, right, initial encounter      Discussed at length with pt pros and cons of rabies vaccine, she understands there is no cure for rabies.  It sounds like cat was behaving normally.  Only when she tried to pet it did the cat bite.  Cat was not acting erratically up to point of bite.  Will contact animal control to locate cat.  Pt received tetanus.  Will gvie Rx for abx . Pt has pain meds at home already, aleve and norco.      Gavin Pound. Ashaunti Treptow, MD 09/20/12 1228

## 2012-09-25 ENCOUNTER — Ambulatory Visit (INDEPENDENT_AMBULATORY_CARE_PROVIDER_SITE_OTHER): Payer: Medicare Other | Admitting: Internal Medicine

## 2012-09-25 ENCOUNTER — Encounter: Payer: Self-pay | Admitting: Internal Medicine

## 2012-09-25 VITALS — BP 120/78 | HR 65 | Temp 98.1°F | Wt 153.0 lb

## 2012-09-25 DIAGNOSIS — Z23 Encounter for immunization: Secondary | ICD-10-CM

## 2012-09-25 DIAGNOSIS — Z5189 Encounter for other specified aftercare: Secondary | ICD-10-CM

## 2012-09-25 DIAGNOSIS — S51851D Open bite of right forearm, subsequent encounter: Secondary | ICD-10-CM

## 2012-09-25 NOTE — Progress Notes (Signed)
  Subjective:    Patient ID: Nancy Kent, female    DOB: July 01, 1925, 79 y.o.   MRN: [id number]  HPI Cat bite Septer 20th. Reviewed ED note. Patient declined rabies - in this area carriers are dogs, fox, bats. Very rarely will cats be carriers. She has almost finished augmentin. Bite sites are tender but no drainage, no fever.   PMH, FamHx and SocHx reviewed for any changes and relevance. Current Outpatient Prescriptions on File Prior to Visit  Medication Sig Dispense Refill  . amoxicillin-clavulanate (AUGMENTIN) 875-125 MG per tablet Take 1 tablet by mouth 2 (two) times daily.  14 tablet  0   No current facility-administered medications on file prior to visit.      Review of Systems System review is negative for any constitutional, cardiac, pulmonary, GI or neuro symptoms or complaints other than as described in the HPI.     Objective:   Physical Exam Filed Vitals:   09/25/12 1408  BP: 120/78  Pulse: 65  Temp: 98.1 F (36.7 C)   Gen'l- WNWD woman in no distress Cor- RRR Pulm - normal Derm - several cat puncture wounds right forearm. All look good: no swelling, no surrounding erythema, no drainage       Assessment & Plan:  Cat bite   -   Wounds look fine.  Plan Wash daily with soap and water.  Cover with a bandaide  Use alcohol swab to help remove the bandaid.

## 2012-09-25 NOTE — Patient Instructions (Addendum)
Cat bite   -   Wounds look fine.  Plan Wash daily with soap and water.  Cover with a bandaide  Use alcohol swab to help remove the bandaid.

## 2013-03-17 ENCOUNTER — Ambulatory Visit: Payer: Medicare Other | Admitting: *Deleted

## 2013-03-17 ENCOUNTER — Encounter: Payer: Self-pay | Admitting: Physician Assistant

## 2013-03-17 ENCOUNTER — Ambulatory Visit (INDEPENDENT_AMBULATORY_CARE_PROVIDER_SITE_OTHER): Payer: Medicare Other | Admitting: Physician Assistant

## 2013-03-17 VITALS — BP 120/78 | HR 76 | Temp 97.7°F | Wt 151.2 lb

## 2013-03-17 VITALS — BP 138/90

## 2013-03-17 DIAGNOSIS — R079 Chest pain, unspecified: Secondary | ICD-10-CM

## 2013-03-17 DIAGNOSIS — F411 Generalized anxiety disorder: Secondary | ICD-10-CM

## 2013-03-17 DIAGNOSIS — F418 Other specified anxiety disorders: Secondary | ICD-10-CM

## 2013-03-17 DIAGNOSIS — F419 Anxiety disorder, unspecified: Secondary | ICD-10-CM

## 2013-03-17 NOTE — Progress Notes (Signed)
Subjective:    Patient ID: Nancy Kent, female    DOB: 07/24/1925, 80 y.o.   MRN: [id number]  HPI Comments: Patient is an 80 year old female who presents with complaint of feeling jittery and chest pressure. Patient reports the symptoms started approximately five days prior to arrival. States four days PTA she was at home and symptoms were at their worse. States she called 911 and the EMS came to her home and did an ECG on her and told her she was fine that it was nothing heart related. Patient reports she is stressed and worn out. Explains her husband has been ill and in the hospital and she has been his only caregiver at home. Reports she is to stubborn to get help. States the chest pain is more of a pressure in her chest like a balloon being blown up in her chest. She will notice it for a few minutes then it goes away but the jittery feeling remains.  Pain does not radiate. States she often feels jittery like all of her insides are shaking but can not see it from the outside. Has had headache in the back of her head like her normal stress headache. Has used half of a Xanax tablet at night with some relief of symptoms.  Denies N/V, diaphoresis, depression, thought of hurting herself, visual changes/disturbances, numbness, tingling or weakness. Denies SOB or palpitations.    Review of Systems  Constitutional: Negative for fever, chills and diaphoresis.  HENT: Negative for drooling.   Eyes: Negative for pain and visual disturbance.  Respiratory: Negative for cough, chest tightness and shortness of breath.   Cardiovascular: Positive for chest pain (a pressure sensation pushing out). Negative for palpitations.  Gastrointestinal: Negative for nausea and vomiting.  Neurological: Positive for headaches. Negative for dizziness, weakness, light-headedness and numbness.       Feels "jittery" on inside of body from all over   Past Medical History  Diagnosis Date  . Hyperlipidemia   . Shingles  2010    happended after she had vaccine.       Objective:   Physical Exam  Vitals reviewed. Constitutional: She is oriented to person, place, and time. She appears well-developed and well-nourished. No distress.  Voice is slightly jittery when talking  HENT:  Head: Normocephalic and atraumatic.  Right Ear: External ear normal.  Left Ear: External ear normal.  Eyes: Conjunctivae are normal.  Neck: Normal range of motion.  Cardiovascular: Normal rate, regular rhythm and normal heart sounds.  Exam reveals no gallop and no friction rub.   No murmur heard. Pulses:      Radial pulses are 2+ on the right side, and 2+ on the left side.       Posterior tibial pulses are 2+ on the right side, and 2+ on the left side.  Pulmonary/Chest: Effort normal and breath sounds normal. She has no wheezes. She has no rales.  Neurological: She is alert and oriented to person, place, and time.  Skin: Skin is warm and dry. She is not diaphoretic.  Psychiatric: Her speech is normal and behavior is normal. Judgment and thought content normal. Her mood appears anxious (wringing hands while talking at times). Cognition and memory are normal.    Lab Results  Component Value Date   WBC 5.4 02/25/2012   HGB 14.1 02/25/2012   HCT 42.2 02/25/2012   PLT 152.0 02/25/2012   GLUCOSE 84 02/25/2012   CHOL 239* 02/25/2012   TRIG 48.0 02/25/2012  HDL 83.50 02/25/2012   LDLDIRECT 132.4 02/25/2012   ALT 20 02/25/2012   AST 28 02/25/2012   NA 140 02/25/2012   K 4.8 02/25/2012   CL 106 02/25/2012   CREATININE 0.9 02/25/2012   BUN 16 02/25/2012   CO2 27 02/25/2012   TSH 0.74 02/25/2012   Filed Vitals:   03/17/13 1454  BP: 120/78  Pulse: 76  Temp: 97.7 F (36.5 C)   ECG today: Sinus rhythm, rate 62 bpm    Assessment & Plan:    Anxiety: Appears anxiety more than cardiac complications Patient anxious over husbands health and care Instructed to use Xanax 0.5 mg tablets every 8 hours as needed for anxiety.  To follow up  with Dr. Felicity Coyer.

## 2013-03-17 NOTE — Patient Instructions (Signed)
It was great to meet you today Nancy Kent!   I would like you to increase your Xanax to a full tablet every 8 hours as needed for anxiety.  If no improvement of symptoms please return for a follow up visit.   If symptoms become worse, like increase in chest pain, headache, shortness of breath please phone 911.  Generalized Anxiety Disorder Generalized anxiety disorder (GAD) is a mental disorder. It interferes with life functions, including relationships, work, and school. GAD is different from normal anxiety, which everyone experiences at some point in their lives in response to specific life events and activities. Normal anxiety actually helps us prepare for and get through these life events and activities. Normal anxiety goes away after the event or activity is over.  GAD causes anxiety that is not necessarily related to specific events or activities. It also causes excess anxiety in proportion to specific events or activities. The anxiety associated with GAD is also difficult to control. GAD can vary from mild to severe. People with severe GAD can have intense waves of anxiety with physical symptoms (panic attacks).  SYMPTOMS The anxiety and worry associated with GAD are difficult to control. This anxiety and worry are related to many life events and activities and also occur more days than not for 6 months or longer. People with GAD also have three or more of the following symptoms (one or more in children):  Restlessness.   Fatigue.  Difficulty concentrating.   Irritability.  Muscle tension.  Difficulty sleeping or unsatisfying sleep. DIAGNOSIS GAD is diagnosed through an assessment by your caregiver. Your caregiver will ask you questions aboutyour mood,physical symptoms, and events in your life. Your caregiver may ask you about your medical history and use of alcohol or drugs, including prescription medications. Your caregiver may also do a physical exam and blood tests.  Certain medical conditions and the use of certain substances can cause symptoms similar to those associated with GAD. Your caregiver may refer you to a mental health specialist for further evaluation. TREATMENT The following therapies are usually used to treat GAD:   Medication Antidepressant medication usually is prescribed for long-term daily control. Antianxiety medications may be added in severe cases, especially when panic attacks occur.   Talk therapy (psychotherapy) Certain types of talk therapy can be helpful in treating GAD by providing support, education, and guidance. A form of talk therapy called cognitive behavioral therapy can teach you healthy ways to think about and react to daily life events and activities.  Stress managementtechniques These include yoga, meditation, and exercise and can be very helpful when they are practiced regularly. A mental health specialist can help determine which treatment is best for you. Some people see improvement with one therapy. However, other people require a combination of therapies. Document Released: 04/14/2012 Document Reviewed: 04/14/2012 [facility] Patient Information 2014 [place] [place].

## 2013-03-17 NOTE — Progress Notes (Signed)
Pre visit review using our clinic review tool, if applicable. No additional management support is needed unless otherwise documented below in the visit note. 

## 2013-03-19 ENCOUNTER — Ambulatory Visit: Payer: Medicare Other | Admitting: Internal Medicine

## 2013-04-01 ENCOUNTER — Encounter: Payer: Self-pay | Admitting: Internal Medicine

## 2013-04-01 ENCOUNTER — Ambulatory Visit (INDEPENDENT_AMBULATORY_CARE_PROVIDER_SITE_OTHER): Payer: Medicare Other | Admitting: Internal Medicine

## 2013-04-01 VITALS — BP 100/68 | HR 86 | Temp 97.8°F | Wt 147.0 lb

## 2013-04-01 DIAGNOSIS — F4323 Adjustment disorder with mixed anxiety and depressed mood: Secondary | ICD-10-CM

## 2013-04-01 DIAGNOSIS — F4321 Adjustment disorder with depressed mood: Secondary | ICD-10-CM

## 2013-04-01 NOTE — Progress Notes (Signed)
Pre visit review using our clinic review tool, if applicable. No additional management support is needed unless otherwise documented below in the visit note. 

## 2013-04-01 NOTE — Progress Notes (Signed)
   Subjective:    Patient ID: Carole BinningShirley G Whitter, female    DOB: [birth year], 80 y.o.   MRN: [id number]  HPI  New patient to me, transferred from Dr. Alvera NovelNorin's because of retirement -but personally known to me as I cared for her husband Homero FellersFrank.  Homero FellersFrank passed away of pneumonia 03/21/2013 at the age of 80. She and Homero FellersFrank had been married 34 years.  Also reviewed chronic medical issues and interval medical events  Past Medical History  Diagnosis Date  . Hyperlipidemia   . Shingles 2010    happended after she had vaccine.    Review of Systems  Constitutional: Negative for fever, fatigue and unexpected weight change.  Respiratory: Negative for cough and shortness of breath.   Cardiovascular: Negative for chest pain and leg swelling.  Psychiatric/Behavioral: Positive for sleep disturbance, dysphoric mood and decreased concentration. Negative for hallucinations and self-injury. The patient is nervous/anxious. The patient is not hyperactive.        Objective:   Physical Exam  BP 100/68  Pulse 86  Temp(Src) 97.8 F (36.6 C) (Oral)  Wt 147 lb (66.679 kg)  SpO2 97% Wt Readings from Last 3 Encounters:  04/01/13 147 lb (66.679 kg)  03/17/13 151 lb 3.2 oz (68.584 kg)  09/25/12 153 lb (69.4 kg)   Constitutional: She is overweight appears well-developed and well-nourished. No distress.  Neck: Normal range of motion. Neck supple. No JVD present. No thyromegaly present.  Cardiovascular: Normal rate, regular rhythm and normal heart sounds.  No murmur heard. No BLE edema. Pulmonary/Chest: Effort normal and breath sounds normal. No respiratory distress. She has no wheezes.  Psychiatric: She has but appropriately tearful and periodically sad mood and affect. Her behavior is normal. Judgment and thought content normal.   Lab Results  Component Value Date   WBC 5.4 02/25/2012   HGB 14.1 02/25/2012   HCT 42.2 02/25/2012   PLT 152.0 02/25/2012   GLUCOSE 84 02/25/2012   CHOL 239* 02/25/2012   TRIG  48.0 02/25/2012   HDL 83.50 02/25/2012   LDLDIRECT 132.4 02/25/2012   ALT 20 02/25/2012   AST 28 02/25/2012   NA 140 02/25/2012   K 4.8 02/25/2012   CL 106 02/25/2012   CREATININE 0.9 02/25/2012   BUN 16 02/25/2012   CO2 27 02/25/2012   TSH 0.74 02/25/2012    No results found.     Assessment & Plan:   Problem List Items Addressed This Visit   None    Visit Diagnoses   Situational mixed anxiety and depressive disorder    -  Primary    Grief reaction          Time spent with pt today 30 minutes, greater than 50% time spent counseling patient on grief reaction to death of husband 2 weeks ago at age 80 following 34 years of marriage, situational anxiety and depression and medication review. Also review of prior records  Advised to continue Xanax as needed for grief reaction No medicine changes otherwise recommended  Updated patient's desire to be DO NOT RESUSCITATE -signed and updated most form which states limited intervention, no feeding tube, no mechanical ventilation, time-limited IV fluids or antibiotics only if felt to be appropriate for disease eradication, not simply life-prolonging  Patient wishes for her body to be donated to [employer] [facility] after her life has ended

## 2013-04-01 NOTE — Patient Instructions (Signed)
It was good to see you today.  We have reviewed your prior records including labs and tests today  Medications reviewed and updated, no changes recommended at this time.  Please schedule followup in 3-4 months for annual exam, call sooner if problems.

## 2013-04-02 ENCOUNTER — Other Ambulatory Visit: Payer: Self-pay

## 2013-04-02 MED ORDER — ALPRAZOLAM 0.5 MG PO TABS: 0.2500 mg | ORAL_TABLET | Freq: Three times a day (TID) | ORAL | Status: DC | PRN

## 2013-04-02 NOTE — Telephone Encounter (Signed)
Done. thanks

## 2013-04-02 NOTE — Telephone Encounter (Signed)
Faxed script [facility] to [facility]...Raechel Chute/lmb

## 2013-04-02 NOTE — Telephone Encounter (Signed)
The patient is there at the pharmacy hoping to get a refill of her Xanax rx.  (the last time this was filled was Nov 14) but due to her husband passing away, the pt is hoping to get the medication refilled.   [place] [facility] [place] [address] ( callback [phone]- [phone])

## 2013-04-24 ENCOUNTER — Ambulatory Visit: Payer: Medicare Other | Admitting: Internal Medicine

## 2013-07-06 ENCOUNTER — Other Ambulatory Visit: Payer: Self-pay

## 2013-07-06 MED ORDER — ALPRAZOLAM 0.5 MG PO TABS: 0.2500 mg | ORAL_TABLET | Freq: Three times a day (TID) | ORAL | Status: DC | PRN

## 2013-07-06 NOTE — Telephone Encounter (Signed)
Faxed script [facility] to [facility].../lmb 

## 2013-08-03 ENCOUNTER — Ambulatory Visit (INDEPENDENT_AMBULATORY_CARE_PROVIDER_SITE_OTHER): Payer: Medicare Other | Admitting: Internal Medicine

## 2013-08-03 ENCOUNTER — Encounter: Payer: Self-pay | Admitting: Internal Medicine

## 2013-08-03 VITALS — BP 122/72 | HR 62 | Temp 97.7°F | Ht 65.5 in | Wt 134.5 lb

## 2013-08-03 DIAGNOSIS — R5383 Other fatigue: Secondary | ICD-10-CM

## 2013-08-03 DIAGNOSIS — E785 Hyperlipidemia, unspecified: Secondary | ICD-10-CM

## 2013-08-03 DIAGNOSIS — F418 Other specified anxiety disorders: Secondary | ICD-10-CM

## 2013-08-03 DIAGNOSIS — F341 Dysthymic disorder: Secondary | ICD-10-CM

## 2013-08-03 DIAGNOSIS — Z0000 Encounter for general adult medical examination without abnormal findings: Secondary | ICD-10-CM

## 2013-08-03 DIAGNOSIS — R5381 Other malaise: Secondary | ICD-10-CM

## 2013-08-03 DIAGNOSIS — R634 Abnormal weight loss: Secondary | ICD-10-CM

## 2013-08-03 MED ORDER — ALPRAZOLAM 0.5 MG PO TABS: 0.2500 mg | ORAL_TABLET | Freq: Three times a day (TID) | ORAL | Status: DC | PRN

## 2013-08-03 NOTE — Patient Instructions (Addendum)
It was good to see you today.  We have reviewed your prior records including labs and tests today  Test(s) ordered today. Return when you are fasting. Your results will be released to [facility] (or called to you) after review, usually within 72hours after test completion. If any changes need to be made, you will be notified at that same time.  Medications reviewed and updated, no changes recommended at this time. Refill on medication(s) as discussed today.  Please schedule followup in 3-4 months, call sooner if problems. Health Maintenance Adopting a healthy lifestyle and getting preventive care can go a long way to promote health and wellness. Talk with your health care provider about what schedule of regular examinations is right for you. This is a good chance for you to check in with your provider about disease prevention and staying healthy. In between checkups, there are plenty of things you can do on your own. Experts have done a lot of research about which lifestyle changes and preventive measures are most likely to keep you healthy. Ask your health care provider for more information. WEIGHT AND DIET  Eat a healthy diet  Be sure to include plenty of vegetables, fruits, low-fat dairy products, and lean protein.  Do not eat a lot of foods high in solid fats, added sugars, or salt.  Get regular exercise. This is one of the most important things you can do for your health.  Most adults should exercise for at least 150 minutes each week. The exercise should increase your heart rate and make you sweat (moderate-intensity exercise).  Most adults should also do strengthening exercises at least twice a week. This is in addition to the moderate-intensity exercise.  Maintain a healthy weight  Body mass index (BMI) is a measurement that can be used to identify possible weight problems. It estimates body fat based on height and weight. Your health care provider can help determine your BMI and  help you achieve or maintain a healthy weight.  For females 63 years of age and older:   A BMI below 18.5 is considered underweight.  A BMI of 18.5 to 24.9 is normal.  A BMI of 25 to 29.9 is considered overweight.  A BMI of 30 and above is considered obese.  Watch levels of cholesterol and blood lipids  You should start having your blood tested for lipids and cholesterol at 78 years of age, then have this test every 5 years.  You may need to have your cholesterol levels checked more often if:  Your lipid or cholesterol levels are high.  You are older than 78 years of age.  You are at high risk for heart disease.  CANCER SCREENING   Lung Cancer  Lung cancer screening is recommended for adults 73-43 years old who are at high risk for lung cancer because of a history of smoking.  A yearly low-dose CT scan of the lungs is recommended for people who:  Currently smoke.  Have quit within the past 15 years.  Have at least a 30-pack-year history of smoking. A pack year is smoking an average of one pack of cigarettes a day for 1 year.  Yearly screening should continue until it has been 15 years since you quit.  Yearly screening should stop if you develop a health problem that would prevent you from having lung cancer treatment.  Breast Cancer  Practice breast self-awareness. This means understanding how your breasts normally appear and feel.  It also means doing regular breast  self-exams. Let your health care provider know about any changes, no matter how small.  If you are in your 20s or 30s, you should have a clinical breast exam (CBE) by a health care provider every 1-3 years as part of a regular health exam.  If you are 59 or older, have a CBE every year. Also consider having a breast X-ray (mammogram) every year.  If you have a family history of breast cancer, talk to your health care provider about genetic screening.  If you are at high risk for breast cancer, talk  to your health care provider about having an MRI and a mammogram every year.  Breast cancer gene (BRCA) assessment is recommended for women who have family members with BRCA-related cancers. BRCA-related cancers include:  Breast.  Ovarian.  Tubal.  Peritoneal cancers.  Results of the assessment will determine the need for genetic counseling and BRCA1 and BRCA2 testing. Cervical Cancer Routine pelvic examinations to screen for cervical cancer are no longer recommended for nonpregnant women who are considered low risk for cancer of the pelvic organs (ovaries, uterus, and vagina) and who do not have symptoms. A pelvic examination may be necessary if you have symptoms including those associated with pelvic infections. Ask your health care provider if a screening pelvic exam is right for you.   The Pap test is the screening test for cervical cancer for women who are considered at risk.  If you had a hysterectomy for a problem that was not cancer or a condition that could lead to cancer, then you no longer need Pap tests.  If you are older than 65 years, and you have had normal Pap tests for the past 10 years, you no longer need to have Pap tests.  If you have had past treatment for cervical cancer or a condition that could lead to cancer, you need Pap tests and screening for cancer for at least 20 years after your treatment.  If you no longer get a Pap test, assess your risk factors if they change (such as having a new sexual partner). This can affect whether you should start being screened again.  Some women have medical problems that increase their chance of getting cervical cancer. If this is the case for you, your health care provider may recommend more frequent screening and Pap tests.  The human papillomavirus (HPV) test is another test that may be used for cervical cancer screening. The HPV test looks for the virus that can cause cell changes in the cervix. The cells collected during  the Pap test can be tested for HPV.  The HPV test can be used to screen women 21 years of age and older. Getting tested for HPV can extend the interval between normal Pap tests from three to five years.  An HPV test also should be used to screen women of any age who have unclear Pap test results.  After 78 years of age, women should have HPV testing as often as Pap tests.  Colorectal Cancer  This type of cancer can be detected and often prevented.  Routine colorectal cancer screening usually begins at 78 years of age and continues through 78 years of age.  Your health care provider may recommend screening at an earlier age if you have risk factors for colon cancer.  Your health care provider may also recommend using home test kits to check for hidden blood in the stool.  A small camera at the end of a tube can be  used to examine your colon directly (sigmoidoscopy or colonoscopy). This is done to check for the earliest forms of colorectal cancer.  Routine screening usually begins at age 46.  Direct examination of the colon should be repeated every 5-10 years through 78 years of age. However, you may need to be screened more often if early forms of precancerous polyps or small growths are found. Skin Cancer  Check your skin from head to toe regularly.  Tell your health care provider about any new moles or changes in moles, especially if there is a change in a mole's shape or color.  Also tell your health care provider if you have a mole that is larger than the size of a pencil eraser.  Always use sunscreen. Apply sunscreen liberally and repeatedly throughout the day.  Protect yourself by wearing long sleeves, pants, a wide-brimmed hat, and sunglasses whenever you are outside. HEART DISEASE, DIABETES, AND HIGH BLOOD PRESSURE   Have your blood pressure checked at least every 1-2 years. High blood pressure causes heart disease and increases the risk of stroke.  If you are between 57  years and 70 years old, ask your health care provider if you should take aspirin to prevent strokes.  Have regular diabetes screenings. This involves taking a blood sample to check your fasting blood sugar level.  If you are at a normal weight and have a low risk for diabetes, have this test once every three years after 78 years of age.  If you are overweight and have a high risk for diabetes, consider being tested at a younger age or more often. PREVENTING INFECTION  Hepatitis B  If you have a higher risk for hepatitis B, you should be screened for this virus. You are considered at high risk for hepatitis B if:  You were born in a country where hepatitis B is common. Ask your health care provider which countries are considered high risk.  Your parents were born in a high-risk country, and you have not been immunized against hepatitis B (hepatitis B vaccine).  You have HIV or AIDS.  You use needles to inject street drugs.  You live with someone who has hepatitis B.  You have had sex with someone who has hepatitis B.  You get hemodialysis treatment.  You take certain medicines for conditions, including cancer, organ transplantation, and autoimmune conditions. Hepatitis C  Blood testing is recommended for:  Everyone born from [birth year] through 1963.  Anyone with known risk factors for hepatitis C. Sexually transmitted infections (STIs)  You should be screened for sexually transmitted infections (STIs) including gonorrhea and chlamydia if:  You are sexually active and are younger than 78 years of age.  You are older than 78 years of age and your health care provider tells you that you are at risk for this type of infection.  Your sexual activity has changed since you were last screened and you are at an increased risk for chlamydia or gonorrhea. Ask your health care provider if you are at risk.  If you do not have HIV, but are at risk, it may be recommended that you take a  prescription medicine daily to prevent HIV infection. This is called pre-exposure prophylaxis (PrEP). You are considered at risk if:  You are sexually active and do not regularly use condoms or know the HIV status of your partner(s).  You take drugs by injection.  You are sexually active with a partner who has HIV. Talk with your health care  provider about whether you are at high risk of being infected with HIV. If you choose to begin PrEP, you should first be tested for HIV. You should then be tested every 3 months for as long as you are taking PrEP.  PREGNANCY   If you are premenopausal and you may become pregnant, ask your health care provider about preconception counseling.  If you may become pregnant, take 400 to 800 micrograms (mcg) of folic acid every day.  If you want to prevent pregnancy, talk to your health care provider about birth control (contraception). OSTEOPOROSIS AND MENOPAUSE   Osteoporosis is a disease in which the bones lose minerals and strength with aging. This can result in serious bone fractures. Your risk for osteoporosis can be identified using a bone density scan.  If you are 28 years of age or older, or if you are at risk for osteoporosis and fractures, ask your health care provider if you should be screened.  Ask your health care provider whether you should take a calcium or vitamin D supplement to lower your risk for osteoporosis.  Menopause may have certain physical symptoms and risks.  Hormone replacement therapy may reduce some of these symptoms and risks. Talk to your health care provider about whether hormone replacement therapy is right for you.  HOME CARE INSTRUCTIONS   Schedule regular health, dental, and eye exams.  Stay current with your immunizations.   Do not use any tobacco products including cigarettes, chewing tobacco, or electronic cigarettes.  If you are pregnant, do not drink alcohol.  If you are breastfeeding, limit how much and  how often you drink alcohol.  Limit alcohol intake to no more than 1 drink per day for nonpregnant women. One drink equals 12 ounces of beer, 5 ounces of wine, or 1 ounces of hard liquor.  Do not use street drugs.  Do not share needles.  Ask your health care provider for help if you need support or information about quitting drugs.  Tell your health care provider if you often feel depressed.  Tell your health care provider if you have ever been abused or do not feel safe at home. Document Released: 07/03/2010 Document Revised: 05/04/2013 Document Reviewed: 11/19/2012 [facility] Patient Information 2015 [place], [place]. This information is not intended to replace advice given to you by your health care provider. Make sure you discuss any questions you have with your health care provider.

## 2013-08-03 NOTE — Assessment & Plan Note (Signed)
On chronic BZs Exacerbated by situational grief following loss of spouse over >8545yr Nancy Kent (age 80) 03/2013 Emotional support provided - refills today [place] close to [place] and family

## 2013-08-03 NOTE — Progress Notes (Signed)
Pre visit review using our clinic review tool, if applicable. No additional management support is needed unless otherwise documented below in the visit note. 

## 2013-08-03 NOTE — Assessment & Plan Note (Signed)
Never on med tx Manages with diet

## 2013-08-03 NOTE — Progress Notes (Signed)
Subjective:    Patient ID: [name], female    DOB: 02-03-1925, 80 y.o.   MRN: [id number]  HPI   Here for medicare wellness  Diet: heart healthy Physical activity: sedentary Depression/mood screen: reviewed Hearing: intact to whispered voice Visual acuity: grossly normal, performs annual eye exam  ADLs: capable Fall risk: none Home safety: good Cognitive evaluation: intact to orientation, naming, recall and repetition EOL planning: adv directives, full code/ I agree  I have personally reviewed and have noted 1. The patient's medical and social history 2. Their use of alcohol, tobacco or illicit drugs 3. Their current medications and supplements 4. The patient's functional ability including ADL's, fall risks, home safety risks and hearing or visual impairment. 5. Diet and physical activities 6. Evidence for depression or mood disorders  Also reviewed chronic medical issues and interval medical events  Past Medical History  Diagnosis Date  . Hyperlipidemia   . Shingles 2010    happended after she had vaccine.   Family History  Problem Relation Age of Onset  . Heart disease Mother     CAD/MI - fatal  . Diabetes Mother   . Cancer Father     lung cancer  . Alzheimer's disease Sister   . Dementia Brother   . Heart disease Sister     CAD/MI  . Diabetes Brother    History  Substance Use Topics  . Smoking status: Former Smoker    Quit date: 10/10/1958  . Smokeless tobacco: Never Used  . Alcohol Use: No     Comment: former drinker - social    Review of Systems  Constitutional: Positive for appetite change and unexpected weight change (?unintentional since spouse passed 03/2013). Negative for fatigue.  Respiratory: Negative for cough, shortness of breath and wheezing.   Cardiovascular: Negative for chest pain, palpitations and leg swelling.  Gastrointestinal: Negative for nausea, abdominal pain and diarrhea.  Neurological: Negative for dizziness,  weakness, light-headedness and headaches.  Psychiatric/Behavioral: Positive for dysphoric mood. Negative for suicidal ideas and self-injury. The patient is not nervous/anxious.   All other systems reviewed and are negative.      Objective:   Physical Exam  BP 122/72  Pulse 62  Temp(Src) 97.7 F (36.5 C) (Oral)  Ht 5' 5.5" (1.664 m)  Wt 134 lb 8 oz (61.009 kg)  BMI 22.03 kg/m2  SpO2 95% Wt Readings from Last 3 Encounters:  08/03/13 134 lb 8 oz (61.009 kg)  04/01/13 147 lb (66.679 kg)  03/17/13 151 lb 3.2 oz (68.584 kg)   Constitutional: She appears well-developed and well-nourished. No distress.  Neck: Normal range of motion. Neck supple. No JVD present. No thyromegaly present.  Cardiovascular: Normal rate, regular rhythm and normal heart sounds.  No murmur heard. No BLE edema. Pulmonary/Chest: Effort normal and breath sounds normal. No respiratory distress. She has no wheezes.  Psychiatric: She has a normal mood and affect. Her behavior is normal. Judgment and thought content normal.   Lab Results  Component Value Date   WBC 5.4 02/25/2012   HGB 14.1 02/25/2012   HCT 42.2 02/25/2012   PLT 152.0 02/25/2012   GLUCOSE 84 02/25/2012   CHOL 239* 02/25/2012   TRIG 48.0 02/25/2012   HDL 83.50 02/25/2012   LDLDIRECT 132.4 02/25/2012   ALT 20 02/25/2012   AST 28 02/25/2012   NA 140 02/25/2012   K 4.8 02/25/2012   CL 106 02/25/2012   CREATININE 0.9 02/25/2012   BUN 16 02/25/2012   CO2 27  02/25/2012   TSH 0.74 02/25/2012    No results found.     Assessment & Plan:   AWV/v70.0 - Today patient counseled on age appropriate routine health concerns for screening and prevention, each reviewed and up to date or declined. Immunizations reviewed and up to date or declined. Labs ordered and reviewed. Risk factors for depression reviewed. Hearing function and visual acuity are intact. ADLs screened and addressed as needed. Functional ability and level of safety reviewed and appropriate. Education,  counseling and referrals performed based on assessed risks today. Patient provided with a copy of personalized plan for preventive services.  Fatigue - also associated with unintentional weight loss - check screening labs  Problem List Items Addressed This Visit   Anxiety associated with depression     On chronic BZs Exacerbated by situational grief following loss of spouse over >6928yr Homero FellersFrank (age 80) 03/2013 Emotional support provided - refills today Planning move to [place] to be close to son and family    Hyperlipidemia     Never on med tx Manages with diet    Relevant Orders      Lipid panel   Routine health maintenance - Primary    Other Visit Diagnoses   Other fatigue        Relevant Orders       Basic metabolic panel       CBC with Differential       Hepatic function panel       TSH    Loss of weight        Relevant Orders       Basic metabolic panel       CBC with Differential       Hepatic function panel       TSH

## 2013-08-04 ENCOUNTER — Telehealth: Payer: Self-pay

## 2013-08-04 ENCOUNTER — Other Ambulatory Visit (INDEPENDENT_AMBULATORY_CARE_PROVIDER_SITE_OTHER): Payer: Medicare Other

## 2013-08-04 ENCOUNTER — Telehealth: Payer: Self-pay | Admitting: Internal Medicine

## 2013-08-04 DIAGNOSIS — R5383 Other fatigue: Secondary | ICD-10-CM

## 2013-08-04 DIAGNOSIS — R634 Abnormal weight loss: Secondary | ICD-10-CM

## 2013-08-04 DIAGNOSIS — R079 Chest pain, unspecified: Secondary | ICD-10-CM

## 2013-08-04 DIAGNOSIS — R5381 Other malaise: Secondary | ICD-10-CM

## 2013-08-04 DIAGNOSIS — E785 Hyperlipidemia, unspecified: Secondary | ICD-10-CM

## 2013-08-04 LAB — CBC WITH DIFFERENTIAL/PLATELET
BASOS PCT: 0.7 % (ref 0.0–3.0)
Basophils Absolute: 0 10*3/uL (ref 0.0–0.1)
EOS PCT: 2.8 % (ref 0.0–5.0)
Eosinophils Absolute: 0.1 10*3/uL (ref 0.0–0.7)
HCT: 43.8 % (ref 36.0–46.0)
Hemoglobin: 14.4 g/dL (ref 12.0–15.0)
LYMPHS ABS: 1.8 10*3/uL (ref 0.7–4.0)
Lymphocytes Relative: 39.1 % (ref 12.0–46.0)
MCHC: 32.8 g/dL (ref 30.0–36.0)
MCV: 90.3 fl (ref 78.0–100.0)
MONO ABS: 0.3 10*3/uL (ref 0.1–1.0)
Monocytes Relative: 6.3 % (ref 3.0–12.0)
NEUTROS PCT: 51.1 % (ref 43.0–77.0)
Neutro Abs: 2.3 10*3/uL (ref 1.4–7.7)
PLATELETS: 156 10*3/uL (ref 150.0–400.0)
RBC: 4.85 Mil/uL (ref 3.87–5.11)
RDW: 14.1 % (ref 11.5–15.5)
WBC: 4.6 10*3/uL (ref 4.0–10.5)

## 2013-08-04 LAB — HEPATIC FUNCTION PANEL
ALT: 15 U/L (ref 0–35)
AST: 19 U/L (ref 0–37)
Albumin: 3.9 g/dL (ref 3.5–5.2)
Alkaline Phosphatase: 51 U/L (ref 39–117)
BILIRUBIN DIRECT: 0.1 mg/dL (ref 0.0–0.3)
BILIRUBIN TOTAL: 0.6 mg/dL (ref 0.2–1.2)
Total Protein: 6.5 g/dL (ref 6.0–8.3)

## 2013-08-04 LAB — BASIC METABOLIC PANEL
BUN: 11 mg/dL (ref 6–23)
CHLORIDE: 109 meq/L (ref 96–112)
CO2: 28 meq/L (ref 19–32)
CREATININE: 0.9 mg/dL (ref 0.4–1.2)
Calcium: 9.1 mg/dL (ref 8.4–10.5)
GFR: 61.55 mL/min (ref >60.00)
GLUCOSE: 86 mg/dL (ref 70–99)
Potassium: 4.1 mEq/L (ref 3.5–5.1)
Sodium: 143 mEq/L (ref 135–145)

## 2013-08-04 LAB — LIPID PANEL
CHOL/HDL RATIO: 4
CHOLESTEROL: 218 mg/dL — AB (ref 0–200)
HDL: 57.9 mg/dL (ref >39.00)
LDL Cholesterol: 149 mg/dL — ABNORMAL HIGH (ref 0–99)
NonHDL: 160.1
TRIGLYCERIDES: 57 mg/dL (ref 0.0–149.0)
VLDL: 11.4 mg/dL (ref 0.0–40.0)

## 2013-08-04 LAB — TSH: TSH: 0.8 u[IU]/mL (ref 0.35–4.50)

## 2013-08-04 NOTE — Telephone Encounter (Signed)
[facility] stress test [facility] of ECG [facility]

## 2013-08-04 NOTE — Telephone Encounter (Signed)
Pt stated that she is going to have a stress done first and then will see if she needs a mental health counselor. I let her know that if she needed one we would help her get one but whatever she need to just let us know so we could help.

## 2013-08-04 NOTE — Telephone Encounter (Signed)
Pt came into the office today. She wrote a note for MD  "yesterday I forgot : this matter is pertaining to Frank's will. His attorney agreed to sign an affidavit stating she made a clerical error if I agreed not to file a mal practice suit. I agreed. However if I should die before estate is resolved I want my son/daughter to file something. Should I see a mental health counselor?"

## 2013-08-04 NOTE — Telephone Encounter (Signed)
Spoke with patient and notified patient she will [facility]

## 2013-08-04 NOTE — Telephone Encounter (Signed)
Patient thought she was to have an EKG but there is not an order in the system.  Please advise.  Thanks!

## 2013-08-04 NOTE — Telephone Encounter (Signed)
Counseling for support is generally beneficial Let me know if referral is needed

## 2013-08-05 ENCOUNTER — Other Ambulatory Visit: Payer: Self-pay | Admitting: Internal Medicine

## 2013-08-05 ENCOUNTER — Other Ambulatory Visit (INDEPENDENT_AMBULATORY_CARE_PROVIDER_SITE_OTHER): Payer: Medicare Other

## 2013-08-05 ENCOUNTER — Telehealth: Payer: Self-pay

## 2013-08-05 ENCOUNTER — Other Ambulatory Visit: Payer: Medicare Other

## 2013-08-05 DIAGNOSIS — R300 Dysuria: Secondary | ICD-10-CM

## 2013-08-05 LAB — URINALYSIS, ROUTINE W REFLEX MICROSCOPIC
Bilirubin Urine: NEGATIVE
Ketones, ur: NEGATIVE
NITRITE: POSITIVE — AB
PH: 5.5 (ref 5.0–8.0)
RENAL EPITHEL UA: NONE SEEN
Specific Gravity, Urine: 1.01 (ref 1.000–1.030)
TOTAL PROTEIN, URINE-UPE24: NEGATIVE
URINE GLUCOSE: NEGATIVE
Urobilinogen, UA: 0.2 (ref 0.0–1.0)

## 2013-08-05 MED ORDER — CIPROFLOXACIN HCL 250 MG PO TABS: 250.0000 mg | ORAL_TABLET | Freq: Two times a day (BID) | ORAL | Status: DC

## 2013-08-05 NOTE — Telephone Encounter (Signed)
Urine specimen dropped off and there were no lab orders placed  Getting with MD to advise.   MD advised to add UA w/micro.   Order entered.

## 2013-08-17 ENCOUNTER — Other Ambulatory Visit: Payer: Self-pay | Admitting: Internal Medicine

## 2013-08-20 ENCOUNTER — Ambulatory Visit (HOSPITAL_COMMUNITY): Payer: Medicare Other | Attending: Internal Medicine | Admitting: Radiology

## 2013-08-20 VITALS — BP 129/74 | HR 56 | Ht 65.0 in | Wt 132.0 lb

## 2013-08-20 DIAGNOSIS — R5383 Other fatigue: Secondary | ICD-10-CM

## 2013-08-20 DIAGNOSIS — R5381 Other malaise: Secondary | ICD-10-CM | POA: Diagnosis not present

## 2013-08-20 DIAGNOSIS — R0609 Other forms of dyspnea: Secondary | ICD-10-CM | POA: Diagnosis not present

## 2013-08-20 DIAGNOSIS — R079 Chest pain, unspecified: Secondary | ICD-10-CM | POA: Diagnosis not present

## 2013-08-20 DIAGNOSIS — R0602 Shortness of breath: Secondary | ICD-10-CM

## 2013-08-20 DIAGNOSIS — R002 Palpitations: Secondary | ICD-10-CM | POA: Diagnosis not present

## 2013-08-20 DIAGNOSIS — R0989 Other specified symptoms and signs involving the circulatory and respiratory systems: Secondary | ICD-10-CM | POA: Insufficient documentation

## 2013-08-20 DIAGNOSIS — R42 Dizziness and giddiness: Secondary | ICD-10-CM | POA: Diagnosis not present

## 2013-08-20 DIAGNOSIS — Z8249 Family history of ischemic heart disease and other diseases of the circulatory system: Secondary | ICD-10-CM | POA: Insufficient documentation

## 2013-08-20 DIAGNOSIS — Z87891 Personal history of nicotine dependence: Secondary | ICD-10-CM | POA: Diagnosis not present

## 2013-08-20 MED ORDER — TECHNETIUM TC 99M SESTAMIBI GENERIC - CARDIOLITE
33.0000 | Freq: Once | INTRAVENOUS | Status: AC | PRN
Start: 2013-08-20 — End: 2013-08-20
  Administered 2013-08-20: 33 via INTRAVENOUS

## 2013-08-20 MED ORDER — TECHNETIUM TC 99M SESTAMIBI GENERIC - CARDIOLITE
11.0000 | Freq: Once | INTRAVENOUS | Status: AC | PRN
  Administered 2013-08-20: 11 via INTRAVENOUS

## 2013-08-20 MED ORDER — REGADENOSON 0.4 MG/5ML IV SOLN
0.4000 mg | Freq: Once | INTRAVENOUS | Status: AC
  Administered 2013-08-20: 0.4 mg via INTRAVENOUS

## 2013-08-20 NOTE — Progress Notes (Signed)
[facility] SITE 3 NUCLEAR MED [address] [place], [place] [address] [phone]    Cardiology Nuclear Med Study  Nancy Kent is a 80 y.o. female     MRN : [id number]     DOB: January 09, 1925  Procedure Date: 08/20/2013  Nuclear Med Background Indication for Stress Test:  Evaluation for Ischemia History:  No Cardiac History Cardiac Risk Factors: Family History - CAD, History of Smoking and Lipids  Symptoms:  Chest Pain, Dizziness, DOE, Fatigue and Palpitations   Nuclear Pre-Procedure Caffeine/Decaff Intake:  None NPO After: 6:00am   Lungs:  clear O2 Sat: 95% on room air. IV 0.9% NS with Angio Cath:  22g  IV Site: L Antecubital  IV Started by:  Bonnita LevanJackie Smith, RN  Chest Size (in):  38 Cup Size: B  Height: 5\' 5"  (1.651 m)  Weight:  132 lb (59.875 kg)  BMI:  Body mass index is 21.97 kg/(m^2). Tech Comments:  N/A    Nuclear Med Study 1 or 2 day study: 1 day  Stress Test Type:  Lexiscan  Reading MD: N/A  Order Authorizing Provider:  Rene PaciValerie Leschber, MD  Resting Radionuclide: Technetium 4281m Sestamibi  Resting Radionuclide Dose: 11.0 mCi   Stress Radionuclide:  Technetium 7681m Sestamibi  Stress Radionuclide Dose: 33.0 mCi           Stress Protocol Rest HR: 56 Stress HR: 88  Rest BP: 129/74 Stress BP: 114/77  Exercise Time (min): n/a METS: n/a           Dose of Adenosine (mg):  n/a Dose of Lexiscan: 0.4 mg  Dose of Atropine (mg): n/a Dose of Dobutamine: n/a mcg/kg/min (at max HR)  Stress Test Technologist: Nelson ChimesSharon Brooks, BS-ES  Nuclear Technologist:  Harlow AsaElizabeth Young, CNMT     Rest Procedure:  Myocardial perfusion imaging was performed at rest 45 minutes following the intravenous administration of Technetium 1281m Sestamibi. Rest UXL:KGMWNECG:Sinus bradycardia  56 bpm    Stress Procedure:  The patient received IV Lexiscan 0.4 mg over 15-seconds.  Technetium 2181m Sestamibi injected at 30-seconds.  Quantitative spect images were obtained after a 45 minute  delay. Stress ECG: No significant change from the stress images.    QPS Raw Data Images:  Soft tissue (diaphragm, bowel) underlie heart.  Note increased tracer activity in neck (thyroid) Stress Images:  Normal homogeneous uptake in all areas of the myocardium. Rest Images:  Normal homogeneous uptake in all areas of the myocardium. Subtraction (SDS):  No evidence of ischemia. Transient Ischemic Dilatation (Normal <1.22):  1.19 Lung/Heart Ratio (Normal <0.45):  0.31  Quantitative Gated Spect Images QGS EDV:  62 ml QGS ESV:  19 ml  Impression Exercise Capacity:  Lexiscan with no exercise. BP Response:  Normal blood pressure response. Clinical Symptoms:  No chest pain. ECG Impression:  No significant ST segment change suggestive of ischemia. Comparison with Prior Nuclear Study: {no prior study   Overall Impression:  Normal stress nuclear study.  Note increased tracer activity in neck  Consider USN to define    LV Ejection Fraction: 69%.  LV Wall Motion:  NL LV Function; NL Wall Motion  Nancy Kent

## 2013-08-21 ENCOUNTER — Telehealth: Payer: Self-pay | Admitting: Internal Medicine

## 2013-08-21 NOTE — Telephone Encounter (Signed)
Pt came by office stating that she previously purchased airline tickets to visit her family. Pt states that due to her medical condition she cannot fly. In order to be fully refunded for her flight she needs a letter from Dr Felicity CoyerLeschber stating that she cannot fly. Pt would like the letter to be faxed to [employer]. The flight information and [employer] contact information has been given to [employer]. Please contact pt when request is complete.

## 2013-08-24 NOTE — Telephone Encounter (Signed)
Use dx codes: chest pain, grief reaction and anxiety with depression

## 2013-08-24 NOTE — Telephone Encounter (Signed)
Ok to generate letter for me to sign

## 2013-08-25 NOTE — Telephone Encounter (Deleted)
Nancy Kent I have a copy of her [employer] trip confirmation on my desk that needs to be faxed along with her letter when completed.

## 2013-08-26 ENCOUNTER — Telehealth: Payer: Self-pay | Admitting: Internal Medicine

## 2013-08-26 NOTE — Telephone Encounter (Signed)
Pt request result for stress test that was done 08/20/13. Please call pt

## 2013-08-27 ENCOUNTER — Telehealth: Payer: Self-pay | Admitting: Internal Medicine

## 2013-08-27 ENCOUNTER — Encounter: Payer: Self-pay | Admitting: Internal Medicine

## 2013-08-27 ENCOUNTER — Ambulatory Visit (INDEPENDENT_AMBULATORY_CARE_PROVIDER_SITE_OTHER): Payer: Medicare Other | Admitting: Internal Medicine

## 2013-08-27 VITALS — BP 98/62 | HR 67 | Temp 97.8°F | Wt 132.2 lb

## 2013-08-27 DIAGNOSIS — R3915 Urgency of urination: Secondary | ICD-10-CM

## 2013-08-27 DIAGNOSIS — R350 Frequency of micturition: Secondary | ICD-10-CM

## 2013-08-27 DIAGNOSIS — R300 Dysuria: Secondary | ICD-10-CM

## 2013-08-27 DIAGNOSIS — M5137 Other intervertebral disc degeneration, lumbosacral region: Secondary | ICD-10-CM

## 2013-08-27 DIAGNOSIS — R351 Nocturia: Secondary | ICD-10-CM

## 2013-08-27 DIAGNOSIS — IMO0001 Reserved for inherently not codable concepts without codable children: Secondary | ICD-10-CM

## 2013-08-27 LAB — POCT URINALYSIS DIPSTICK
BILIRUBIN UA: NEGATIVE
Glucose, UA: NEGATIVE
Ketones, UA: NEGATIVE
LEUKOCYTES UA: NEGATIVE
NITRITE UA: NEGATIVE
Protein, UA: NEGATIVE
RBC UA: NEGATIVE
Spec Grav, UA: 1.015
UROBILINOGEN UA: NEGATIVE
pH, UA: 6

## 2013-08-27 MED ORDER — MIRABEGRON ER 25 MG PO TB24: 25.0000 mg | ORAL_TABLET | Freq: Every day | ORAL | Status: AC

## 2013-08-27 NOTE — Progress Notes (Signed)
   Subjective:    Patient ID: Nancy Kent, female    DOB: 17-Jun-1925, 80 y.o.   MRN: [id number]  HPI  She has had symptoms for several months as urgency followed by hesitancy.She also describes frequent urination and intermittent dysuria. She's had some left flank discomfort and intermittently which radiates anteriorly.  She does have sweats occasionally as well.  She has nocturia 3-4 times per night.  On 08/05/13  Urinalysis at that time was grossly abnormal. She was placed on ciprofloxacin which she finished.  She called in  for refill of Cipro because of symptoms above.  She has no past history of renal calculi or GU abnormalities. She's never had a cystoscopy.     Review of Systems  She denies pyuria or hematuria.      Objective:   Physical Exam  General appearance is one of good health and nourishment w/o distress.Appears younger than stated age  Eyes: No conjunctival inflammation or scleral icterus is present.  Heart:  Normal rate and regular rhythm. S1 and S2 normal without gallop, murmur, click, rub or other extra sounds     Lungs:Chest clear to auscultation; no wheezes, rhonchi,rales ,or rubs present.No increased work of breathing.   Abdomen: bowel sounds normal, soft and non-tender without masses, organomegaly or hernias noted.  No guarding or rebound . No tenderness over the flanks to percussion  Musculoskeletal: Able to lie flat and sit up without help. Negative straight leg raising bilaterally. Gait normal  Skin:Warm & dry.  Intact without suspicious lesions or rashes ; no jaundice . Slight tenting  Lymphatic: No lymphadenopathy is noted about the head, neck, axilla              Assessment & Plan:  #1 symptoms of urgency, hesitancy, frequency, and intermittent dysuria. Urinalysis is normal. Rule out overactive bladder  Plan: See orders and recommendations

## 2013-08-27 NOTE — Telephone Encounter (Signed)
Patient is [place] of [place] on [place] 22nd.  She wanted to get in with Dr. Felicity Coyer before that time but there is nothing available.  She would like a written script for xanax to take with her once she moves to get her through until she gets another provider.  Please advise.

## 2013-08-27 NOTE — Telephone Encounter (Signed)
Letter requested is completed and with [employer]der[employer].

## 2013-08-27 NOTE — Patient Instructions (Signed)
Please take the Myrbetriq sample once daily as a trial for your urinary tract symptoms. Drink as much nondairy fluids as possible. Avoid spicy foods or alcohol as  these may aggravate the bladder. Do not take decongestants. Avoid narcotics if possible.

## 2013-08-27 NOTE — Progress Notes (Signed)
Pre visit review using our clinic review tool, if applicable. No additional management support is needed unless otherwise documented below in the visit note. 

## 2013-08-31 NOTE — Telephone Encounter (Signed)
Pt came by to request a referral to urologist and wants to be advised if the referral is necessary. Pt would also like to be advised in whether a med follow up appt in needed based on some symptoms she is currently experiencing while taking a new medication for UTI per Dr Alwyn Ren.   Pt is also moving in October out of state and said she left forms to be completed so that she can successfully end her lease at that time. Would like a follow up on the process of form completion.

## 2013-09-02 NOTE — Telephone Encounter (Signed)
Pt came by to add statement to the letter she has previously requested. Pt says the letter for her lease needs to include that she has to relocate to where her power of attorney family resides and maintain the family address until she is successfully placed into a facility. Please contact pt when request is reviewed. Pt would like the letter mailed to her when completed.  Pt also wants to cancel the request to be referred to urologist.

## 2013-09-03 NOTE — Telephone Encounter (Signed)
Spoke with pt and she stated that she understand the delay. She needs a refill of Xanax once she moves.

## 2013-09-05 MED ORDER — ALPRAZOLAM 0.5 MG PO TABS: 0.2500 mg | ORAL_TABLET | Freq: Three times a day (TID) | ORAL | Status: DC | PRN

## 2013-09-05 NOTE — Telephone Encounter (Signed)
Ok to prepare Xanax refill for me to sign Likewise, I will review the forms on my return Tues AM 9/7 and create additional letter if form completion not sufficent   thanks

## 2013-09-08 ENCOUNTER — Other Ambulatory Visit: Payer: Self-pay

## 2013-09-08 MED ORDER — ALPRAZOLAM 0.5 MG PO TABS: 0.2500 mg | ORAL_TABLET | Freq: Three times a day (TID) | ORAL | Status: AC | PRN

## 2013-09-09 NOTE — Telephone Encounter (Signed)
Informed pt that letter and forms were ready. Pt is coming to the office on Monday for a flu shot and to pick up forms, etc.  Pt asked if there is a walker that is lighter in weight than the one her husband had. If there is, could she get a written rx to take to [place] and have it ordered there.

## 2013-09-14 ENCOUNTER — Ambulatory Visit (INDEPENDENT_AMBULATORY_CARE_PROVIDER_SITE_OTHER): Payer: Medicare Other | Admitting: *Deleted

## 2013-09-14 DIAGNOSIS — Z23 Encounter for immunization: Secondary | ICD-10-CM

## 2013-09-17 ENCOUNTER — Other Ambulatory Visit: Payer: Self-pay | Admitting: Internal Medicine

## 2013-09-17 DIAGNOSIS — M5136 Other intervertebral disc degeneration, lumbar region: Secondary | ICD-10-CM

## 2013-09-17 DIAGNOSIS — M948X9 Other specified disorders of cartilage, unspecified sites: Secondary | ICD-10-CM

## 2013-09-17 NOTE — Telephone Encounter (Signed)
Ok to correct as needed - can have someone sign for me if needed between now and my return thanks

## 2013-09-17 NOTE — Telephone Encounter (Signed)
Pt came by office today with her script that was written for a DME [employer].  Per pt, she needs script to say ROLLATOR not walker platform.  Please call pt when correction has been made and she will come pick it up.  Please advise.  jsl

## 2013-09-21 NOTE — Telephone Encounter (Signed)
Per pt, mailing light weight walker order to pt home.

## 2013-11-02 ENCOUNTER — Ambulatory Visit: Payer: Medicare Other | Admitting: Internal Medicine

## 2013-11-05 ENCOUNTER — Telehealth: Payer: Self-pay | Admitting: *Deleted

## 2013-11-05 DIAGNOSIS — M2040 Other hammer toe(s) (acquired), unspecified foot: Secondary | ICD-10-CM

## 2013-11-05 DIAGNOSIS — M5136 Other intervertebral disc degeneration, lumbar region: Secondary | ICD-10-CM

## 2013-11-05 NOTE — Telephone Encounter (Signed)
Left msg on triage stating md gave her rx for a rollator [place] and in the mist of moving she has lost rx. Requesting another rx to be mail to her...Nancy Kent/lmb

## 2013-11-06 NOTE — Telephone Encounter (Signed)
Called pt no answer LMOM RTC. Place rx on md desk for signature...Raechel Chute/lmb

## 2013-11-06 NOTE — Telephone Encounter (Signed)
Ok to generate rx i will sign to send on Monday thanks

## 2013-11-09 NOTE — Telephone Encounter (Signed)
Called pt again still no answer LMOM RTC.../lmb 

## 2013-11-11 NOTE — Telephone Encounter (Signed)
Called pt again still no answer. Closing encounter.../lmb 

## 2013-11-16 ENCOUNTER — Telehealth: Payer: Self-pay | Admitting: *Deleted

## 2013-11-16 NOTE — Telephone Encounter (Signed)
Pt return call with address to mail the rollator rx. Pt states new address [address] [place], [place] [id number]. Mailed to address...Raechel Chute/lmb

## 2013-11-24 ENCOUNTER — Other Ambulatory Visit: Payer: Self-pay | Admitting: Internal Medicine

## 2013-11-24 DIAGNOSIS — M2040 Other hammer toe(s) (acquired), unspecified foot: Secondary | ICD-10-CM

## 2013-11-24 DIAGNOSIS — M5136 Other intervertebral disc degeneration, lumbar region: Secondary | ICD-10-CM

## 2013-11-25 ENCOUNTER — Telehealth: Payer: Self-pay

## 2013-11-25 NOTE — Telephone Encounter (Signed)
Pt sent a letter stating that she lost the rx for the DME rollator push walker.   Called to let pt know that I would be sending, she stated that she has already received it.
